# Patient Record
Sex: Male | Born: 1955 | Race: Black or African American | Hispanic: No | Smoking: Former smoker
Health system: Southern US, Community
[De-identification: ages and names within clinical notes are randomized; demographics above are authoritative.]

## PROBLEM LIST (undated history)

## (undated) DIAGNOSIS — F1210 Cannabis abuse, uncomplicated: Secondary | ICD-10-CM

## (undated) DIAGNOSIS — K5790 Diverticulosis of intestine, part unspecified, without perforation or abscess without bleeding: Secondary | ICD-10-CM

## (undated) DIAGNOSIS — E119 Type 2 diabetes mellitus without complications: Secondary | ICD-10-CM

## (undated) DIAGNOSIS — B182 Chronic viral hepatitis C: Secondary | ICD-10-CM

## (undated) DIAGNOSIS — K219 Gastro-esophageal reflux disease without esophagitis: Secondary | ICD-10-CM

## (undated) DIAGNOSIS — M5126 Other intervertebral disc displacement, lumbar region: Secondary | ICD-10-CM

## (undated) DIAGNOSIS — K30 Functional dyspepsia: Secondary | ICD-10-CM

## (undated) HISTORY — DX: Functional dyspepsia: K30

## (undated) HISTORY — DX: Type 2 diabetes mellitus without complications: E11.9

## (undated) HISTORY — PX: FACIAL RECONSTRUCTION SURGERY: SHX631

---

## 1997-09-24 ENCOUNTER — Emergency Department (HOSPITAL_COMMUNITY): Admission: EM | Admit: 1997-09-24 | Discharge: 1997-09-24 | Payer: Self-pay | Admitting: Emergency Medicine

## 1998-09-01 ENCOUNTER — Encounter: Payer: Self-pay | Admitting: Emergency Medicine

## 1998-09-01 ENCOUNTER — Encounter: Payer: Self-pay | Admitting: Otolaryngology

## 1998-09-01 ENCOUNTER — Inpatient Hospital Stay (HOSPITAL_COMMUNITY): Admission: EM | Admit: 1998-09-01 | Discharge: 1998-09-12 | Payer: Self-pay | Admitting: Emergency Medicine

## 1998-09-05 ENCOUNTER — Encounter: Payer: Self-pay | Admitting: Otolaryngology

## 2002-08-10 ENCOUNTER — Emergency Department (HOSPITAL_COMMUNITY): Admission: EM | Admit: 2002-08-10 | Discharge: 2002-08-10 | Payer: Self-pay | Admitting: Emergency Medicine

## 2002-08-15 ENCOUNTER — Inpatient Hospital Stay (HOSPITAL_COMMUNITY): Admission: EM | Admit: 2002-08-15 | Discharge: 2002-08-16 | Payer: Self-pay | Admitting: Emergency Medicine

## 2002-08-15 ENCOUNTER — Encounter: Payer: Self-pay | Admitting: Emergency Medicine

## 2002-08-15 ENCOUNTER — Encounter: Payer: Self-pay | Admitting: Internal Medicine

## 2002-09-14 ENCOUNTER — Ambulatory Visit (HOSPITAL_COMMUNITY): Admission: RE | Admit: 2002-09-14 | Discharge: 2002-09-15 | Payer: Self-pay | Admitting: General Surgery

## 2002-09-14 ENCOUNTER — Encounter: Payer: Self-pay | Admitting: General Surgery

## 2003-11-21 ENCOUNTER — Emergency Department (HOSPITAL_COMMUNITY): Admission: EM | Admit: 2003-11-21 | Discharge: 2003-11-21 | Payer: Self-pay | Admitting: Emergency Medicine

## 2005-06-25 HISTORY — PX: TRACHEOSTOMY: SUR1362

## 2009-06-25 HISTORY — PX: CHOLECYSTECTOMY: SHX55

## 2010-12-05 ENCOUNTER — Emergency Department (HOSPITAL_COMMUNITY)
Admission: EM | Admit: 2010-12-05 | Discharge: 2010-12-05 | Disposition: A | Discharge status: Home / Self Care | Payer: Self-pay | Attending: Emergency Medicine | Admitting: Emergency Medicine

## 2010-12-05 ENCOUNTER — Emergency Department (HOSPITAL_COMMUNITY): Payer: Self-pay

## 2010-12-05 DIAGNOSIS — R109 Unspecified abdominal pain: Secondary | ICD-10-CM | POA: Insufficient documentation

## 2010-12-05 DIAGNOSIS — K5900 Constipation, unspecified: Secondary | ICD-10-CM | POA: Insufficient documentation

## 2010-12-05 DIAGNOSIS — R112 Nausea with vomiting, unspecified: Secondary | ICD-10-CM | POA: Insufficient documentation

## 2010-12-05 LAB — POCT I-STAT, CHEM 8
BUN: 16 mg/dL (ref 6–23)
Calcium, Ion: 1.14 mmol/L (ref 1.12–1.32)
Chloride: 101 mEq/L (ref 96–112)
Creatinine, Ser: 1.1 mg/dL (ref 0.4–1.5)
Glucose, Bld: 156 mg/dL — ABNORMAL HIGH (ref 70–99)
HCT: 52 % (ref 39.0–52.0)
Hemoglobin: 17.7 g/dL — ABNORMAL HIGH (ref 13.0–17.0)
Potassium: 4.1 mEq/L (ref 3.5–5.1)
Sodium: 137 mEq/L (ref 135–145)
TCO2: 28 mmol/L (ref 0–100)

## 2010-12-05 LAB — DIFFERENTIAL
Basophils Absolute: 0 10*3/uL (ref 0.0–0.1)
Basophils Relative: 0 % (ref 0–1)
Lymphocytes Relative: 14 % (ref 12–46)
Neutro Abs: 11.2 10*3/uL — ABNORMAL HIGH (ref 1.7–7.7)

## 2010-12-05 LAB — URINALYSIS, ROUTINE W REFLEX MICROSCOPIC
Glucose, UA: 100 mg/dL — AB
Leukocytes, UA: NEGATIVE
Protein, ur: NEGATIVE mg/dL
Specific Gravity, Urine: 1.018 (ref 1.005–1.030)
Urobilinogen, UA: 2 mg/dL — ABNORMAL HIGH (ref 0.0–1.0)

## 2010-12-05 LAB — CBC
HCT: 43.9 % (ref 39.0–52.0)
Hemoglobin: 16.2 g/dL (ref 13.0–17.0)
MCHC: 36.9 g/dL — ABNORMAL HIGH (ref 30.0–36.0)
RDW: 13.7 % (ref 11.5–15.5)
WBC: 13.9 10*3/uL — ABNORMAL HIGH (ref 4.0–10.5)

## 2010-12-05 LAB — URINE MICROSCOPIC-ADD ON

## 2010-12-05 MED ORDER — IOHEXOL 300 MG/ML  SOLN
100.0000 mL | Freq: Once | INTRAMUSCULAR | Status: AC | PRN
  Administered 2010-12-05: 100 mL via INTRAVENOUS

## 2011-09-02 ENCOUNTER — Emergency Department (HOSPITAL_COMMUNITY)
Admission: EM | Admit: 2011-09-02 | Discharge: 2011-09-02 | Disposition: A | Discharge status: Home / Self Care | Payer: Self-pay | Attending: Emergency Medicine | Admitting: Emergency Medicine

## 2011-09-02 ENCOUNTER — Emergency Department (HOSPITAL_COMMUNITY): Payer: Self-pay

## 2011-09-02 ENCOUNTER — Encounter (HOSPITAL_COMMUNITY): Payer: Self-pay | Admitting: Emergency Medicine

## 2011-09-02 DIAGNOSIS — IMO0001 Reserved for inherently not codable concepts without codable children: Secondary | ICD-10-CM | POA: Insufficient documentation

## 2011-09-02 DIAGNOSIS — M5380 Other specified dorsopathies, site unspecified: Secondary | ICD-10-CM | POA: Insufficient documentation

## 2011-09-02 DIAGNOSIS — M79609 Pain in unspecified limb: Secondary | ICD-10-CM | POA: Insufficient documentation

## 2011-09-02 DIAGNOSIS — M5137 Other intervertebral disc degeneration, lumbosacral region: Secondary | ICD-10-CM | POA: Insufficient documentation

## 2011-09-02 DIAGNOSIS — M545 Low back pain, unspecified: Secondary | ICD-10-CM | POA: Insufficient documentation

## 2011-09-02 DIAGNOSIS — M51379 Other intervertebral disc degeneration, lumbosacral region without mention of lumbar back pain or lower extremity pain: Secondary | ICD-10-CM | POA: Insufficient documentation

## 2011-09-02 DIAGNOSIS — IMO0002 Reserved for concepts with insufficient information to code with codable children: Secondary | ICD-10-CM

## 2011-09-02 MED ORDER — DEXAMETHASONE SODIUM PHOSPHATE 4 MG/ML IJ SOLN
8.0000 mg | Freq: Once | INTRAMUSCULAR | Status: AC
  Administered 2011-09-02: 8 mg via INTRAVENOUS
  Filled 2011-09-02: qty 2

## 2011-09-02 MED ORDER — DEXAMETHASONE 4 MG PO TABS: ORAL_TABLET | ORAL | Status: AC

## 2011-09-02 MED ORDER — DIAZEPAM 5 MG PO TABS
5.0000 mg | ORAL_TABLET | Freq: Once | ORAL | Status: AC
  Administered 2011-09-02: 5 mg via ORAL
  Filled 2011-09-02 (×2): qty 1

## 2011-09-02 MED ORDER — SODIUM CHLORIDE 0.9 % IV SOLN
INTRAVENOUS | Status: DC
  Administered 2011-09-02: 09:00:00 via INTRAVENOUS

## 2011-09-02 MED ORDER — OXYCODONE-ACETAMINOPHEN 5-325 MG PO TABS: ORAL_TABLET | ORAL | Status: DC

## 2011-09-02 MED ORDER — ONDANSETRON HCL 4 MG/2ML IJ SOLN
4.0000 mg | Freq: Once | INTRAMUSCULAR | Status: AC
  Administered 2011-09-02: 4 mg via INTRAVENOUS
  Filled 2011-09-02: qty 2

## 2011-09-02 MED ORDER — DIPHENHYDRAMINE HCL 50 MG/ML IJ SOLN
12.5000 mg | Freq: Once | INTRAMUSCULAR | Status: AC
  Administered 2011-09-02: 12.5 mg via INTRAVENOUS
  Filled 2011-09-02: qty 1

## 2011-09-02 MED ORDER — HYDROMORPHONE HCL PF 1 MG/ML IJ SOLN
1.0000 mg | Freq: Once | INTRAMUSCULAR | Status: AC
  Administered 2011-09-02: 1 mg via INTRAVENOUS
  Filled 2011-09-02: qty 1

## 2011-09-02 MED ORDER — BACLOFEN 10 MG PO TABS: 10.0000 mg | ORAL_TABLET | Freq: Three times a day (TID) | ORAL | Status: AC

## 2011-09-02 NOTE — Discharge Instructions (Signed)
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 There is some disease at the L5-S1 area. Please call Dr. Ophelia Charter and see him or a member of his team concerning your back. Please apply heating pad to the lower back. Use the prescribed medications as suggested. Please return if any new or worsening symptoms.Degenerative Disc Disease Degenerative disc disease is a condition caused by the changes that occur in the cushions of the backbone (spinal discs) as you grow older. Spinal discs are soft and compressible discs located between the bones of the spine (vertebrae). They act like shock absorbers. Degenerative disc disease can affect the wholespine. However, the neck and lower back are most commonly affected. Many changes can occur in the spinal discs with aging, such as:  The spinal discs may dry and shrink.   Small tears may occur in the tough, outer covering of the disc (annulus).   The disc space may become smaller due to loss of water.   Abnormal growths in the bone (spurs) may occur. This can put pressure on the nerve roots exiting the spinal canal, causing pain.   The spinal canal may become narrowed.  CAUSES  Degenerative disc disease is a condition caused by the changes that occur in the spinal discs with aging. The exact cause is not known, but there is a genetic basis for many patients. Degenerative changes can occur due to loss of fluid in the disc. This makes the disc thinner and reduces the space between the backbones. Small cracks can develop in the outer layer of the disc. This can lead to the breakdown of the disc. You are more likely to get degenerative disc disease if you are overweight. Smoking cigarettes and doing heavy work such as weightlifting can also increase your risk of this condition. Degenerative changes can start after a sudden injury. Growth of bone spurs can compress the nerve roots and cause pain.  SYMPTOMS  The symptoms vary from person to  person. Some people may have no pain, while others have severe pain. The pain may be so severe that it can limit your activities. The location of the pain depends on the part of your backbone that is affected. You will have neck or arm pain if a disc in the neck area is affected. You will have pain in your back, buttocks, or legs if a disc in the lower back is affected. The pain becomes worse while bending, reaching up, or with twisting movements. The pain may start gradually and then get worse as time passes. It may also start after a major or minor injury. You may feel numbness or tingling in the arms or legs.  DIAGNOSIS  Your caregiver will ask you about your symptoms and about activities or habits that may cause the pain. He or she may also ask about any injuries, diseases, ortreatments you have had earlier. Your caregiver will examine you to check for the range of movement that is possible in the affected area, to check for strength in your extremities, and to check for sensation in the areas of the arms and legs supplied by different nerve roots. An X-ray of the spine may be taken. Your caregiver may suggest other imaging tests, such as a computerized magnetic scan (MRI), if needed.  TREATMENT  Treatment includes rest, modifying your activities, and applying ice and heat. Your caregiver may prescribe medicines to reduce your pain and may ask you to do some exercises to strengthen your back. In  some cases, you may need surgery. You and your caregiver will decide on the treatment that is best for you. HOME CARE INSTRUCTIONS   Follow proper lifting and walking techniques as advised by your caregiver.   Maintain good posture.   Exercise regularly as advised.   Perform relaxation exercises.   Change your sitting, standing, and sleeping habits as advised. Change positions frequently.   Lose weight as advised.   Stop smoking if you smoke.   Wear supportive footwear.  SEEK MEDICAL CARE IF:  The  pain does not go away within 1 to 4 weeks. SEEK IMMEDIATE MEDICAL CARE IF:   The pain is severe.   You notice weakness in your arms, hands, or legs.   You begin to lose control of your bladder or bowel.  MAKE SURE YOU:   Understand these instructions.   Will watch your condition.   Will get help right away if you are not doing well or get worse.  Document Released: 04/08/2007 Document Revised: 05/31/2011 Document Reviewed: 04/08/2007 [facility] Patient Information 2012 [place], [place].

## 2011-09-02 NOTE — ED Notes (Signed)
MD at bedside. 

## 2011-09-02 NOTE — ED Provider Notes (Signed)
History     CSN: [id number]  Arrival date & time 09/02/11  4098   First MD Initiated Contact with Patient 09/02/11 0805      Chief Complaint  Patient presents with  . Back Pain    (Consider location/radiation/quality/duration/timing/severity/associated sxs/prior treatment) HPI Comments: Patient states he was in his usual state of good health until approximately one to one and a half weeks ago when he was lifting would and injured his lower back. The patient states that he rested his back continued to have some mild pain but worked through it. 2 days ago he was again lifting some wood and after this he had pain that was going to both buttocks and down to the foot on the right side. The patient states that he now has to crawl to the bathroom crawled to the truck in order to come to the emergency room, and has almost continuous pain. This pain is particularly increased with certain movements. He denies loss of bowel or bladder function. He's not had any recent falls. The patient has tried Tylenol and over-the-counter rubs, but these have not been successful.  Patient is a 56 y.o. male presenting with back pain. The history is provided by the patient.  Back Pain  Pertinent negatives include no chest pain, no abdominal pain and no dysuria.    History reviewed. No pertinent past medical history.  History reviewed. No pertinent past surgical history.  No family history on file.  History  Substance Use Topics  . Smoking status: Current Everyday Smoker  . Smokeless tobacco: Not on file  . Alcohol Use: No      Review of Systems  Constitutional: Negative for activity change.       All ROS Neg except as noted in HPI  HENT: Negative for nosebleeds and neck pain.   Eyes: Negative for photophobia and discharge.  Respiratory: Negative for cough, shortness of breath and wheezing.   Cardiovascular: Negative for chest pain and palpitations.  Gastrointestinal: Negative for abdominal pain and  blood in stool.  Genitourinary: Negative for dysuria, frequency and hematuria.  Musculoskeletal: Positive for back pain. Negative for arthralgias.  Skin: Negative.   Neurological: Negative for dizziness, seizures and speech difficulty.  Psychiatric/Behavioral: Negative for hallucinations and confusion.    Allergies  Review of patient's allergies indicates no known allergies.  Home Medications  No current outpatient prescriptions on file.  BP 140/86  Pulse 88  Temp 98.1 F (36.7 C)  Resp 20  SpO2 100%  Physical Exam  Nursing note and vitals reviewed. Constitutional: He is oriented to person, place, and time. He appears well-developed and well-nourished.  Non-toxic appearance.  HENT:  Head: Normocephalic.  Right Ear: Tympanic membrane and external ear normal.  Left Ear: Tympanic membrane and external ear normal.  Eyes: EOM and lids are normal. Pupils are equal, round, and reactive to light.  Neck: Normal range of motion. Neck supple. Carotid bruit is not present.  Cardiovascular: Normal rate, regular rhythm, normal heart sounds, intact distal pulses and normal pulses.   Pulmonary/Chest: Breath sounds normal. No respiratory distress.  Abdominal: Soft. Bowel sounds are normal. There is no tenderness. There is no guarding.  Musculoskeletal: Normal range of motion.       Pain to palpation of the lower lumbar area. Pain with movement of the legs and the lower back. Mild-to-moderate spasm palpated of the lumbar area.  Lymphadenopathy:       Head (right side): No submandibular adenopathy present.  Head (left side): No submandibular adenopathy present.    He has no cervical adenopathy.  Neurological: He is alert and oriented to person, place, and time. He has normal strength. No cranial nerve deficit or sensory deficit. He exhibits normal muscle tone. Coordination normal.  Skin: Skin is warm and dry.  Psychiatric: He has a normal mood and affect. His speech is normal.    ED  Course  Procedures (including critical care time)  Labs Reviewed - No data to display No results found.   No diagnosis found.    MDM  I have reviewed nursing notes, vital signs, and all appropriate lab and imaging results for this patient. Patient was lifting wood approximately 1-1-1/2 weeks ago and injured his back. Approximately 2 days ago he again reinjured the back. He now has pain almost constantly, worse with certain movements. Patient is tearful during examination. IV pain medicine, and oral muscle relaxant medicine given at this time. X-ray of the lower back is pending.  Xray of the lumbar spine reveals significant L4-L5 spondylosis. Pain improved after iv pain meds, but not resolved. Pt fitted wit crutches. Pt to return if any changes or concerns before being seen by orthopedics.       Kathie Dike, [place] 09/02/11 [phone]

## 2011-09-02 NOTE — ED Notes (Addendum)
Patient with c/o lower back pain that radiates to left leg x 1 week with worsening pain x 2 days. Patient denies injury.

## 2011-09-02 NOTE — ED Notes (Signed)
Pt c/o lower back pain that radiates down left leg for a week,  was picking up wood when he started to experience lower back pain. Pt states that the pain has gotten worse over the week, unable to tolerate walking without assistance. Pillow provided for support between pt's knees, warm blanket given

## 2011-09-04 NOTE — ED Provider Notes (Signed)
Medical screening examination/treatment/procedure(s) were performed by non-physician practitioner and as supervising physician I was immediately available for consultation/collaboration. Jamyra Zweig, MD, FACEP   Arvind Mexicano L Emmalin Jaquess, MD 09/04/11 1955 

## 2011-10-03 ENCOUNTER — Other Ambulatory Visit (HOSPITAL_COMMUNITY): Payer: Self-pay | Admitting: Orthopaedic Surgery

## 2011-10-03 DIAGNOSIS — M549 Dorsalgia, unspecified: Secondary | ICD-10-CM

## 2011-10-05 ENCOUNTER — Ambulatory Visit (HOSPITAL_COMMUNITY)
Admission: RE | Admit: 2011-10-05 | Discharge: 2011-10-05 | Disposition: A | Discharge status: Home / Self Care | Payer: Self-pay | Source: Ambulatory Visit | Attending: Orthopaedic Surgery | Admitting: Orthopaedic Surgery

## 2011-10-05 DIAGNOSIS — R937 Abnormal findings on diagnostic imaging of other parts of musculoskeletal system: Secondary | ICD-10-CM | POA: Insufficient documentation

## 2011-10-05 DIAGNOSIS — M545 Low back pain, unspecified: Secondary | ICD-10-CM | POA: Insufficient documentation

## 2011-10-05 DIAGNOSIS — M51379 Other intervertebral disc degeneration, lumbosacral region without mention of lumbar back pain or lower extremity pain: Secondary | ICD-10-CM | POA: Insufficient documentation

## 2011-10-05 DIAGNOSIS — M5137 Other intervertebral disc degeneration, lumbosacral region: Secondary | ICD-10-CM | POA: Insufficient documentation

## 2011-10-05 DIAGNOSIS — M79609 Pain in unspecified limb: Secondary | ICD-10-CM | POA: Insufficient documentation

## 2011-10-05 DIAGNOSIS — M47817 Spondylosis without myelopathy or radiculopathy, lumbosacral region: Secondary | ICD-10-CM | POA: Insufficient documentation

## 2011-10-05 DIAGNOSIS — R209 Unspecified disturbances of skin sensation: Secondary | ICD-10-CM | POA: Insufficient documentation

## 2011-10-05 DIAGNOSIS — M549 Dorsalgia, unspecified: Secondary | ICD-10-CM

## 2011-11-12 ENCOUNTER — Encounter (HOSPITAL_COMMUNITY): Payer: Self-pay | Admitting: Emergency Medicine

## 2011-11-12 ENCOUNTER — Emergency Department (HOSPITAL_COMMUNITY)
Admission: EM | Admit: 2011-11-12 | Discharge: 2011-11-12 | Disposition: A | Discharge status: Home / Self Care | Payer: Self-pay | Attending: Emergency Medicine | Admitting: Emergency Medicine

## 2011-11-12 DIAGNOSIS — K529 Noninfective gastroenteritis and colitis, unspecified: Secondary | ICD-10-CM

## 2011-11-12 DIAGNOSIS — R112 Nausea with vomiting, unspecified: Secondary | ICD-10-CM | POA: Insufficient documentation

## 2011-11-12 DIAGNOSIS — F17200 Nicotine dependence, unspecified, uncomplicated: Secondary | ICD-10-CM | POA: Insufficient documentation

## 2011-11-12 DIAGNOSIS — K5289 Other specified noninfective gastroenteritis and colitis: Secondary | ICD-10-CM | POA: Insufficient documentation

## 2011-11-12 DIAGNOSIS — R197 Diarrhea, unspecified: Secondary | ICD-10-CM | POA: Insufficient documentation

## 2011-11-12 DIAGNOSIS — R109 Unspecified abdominal pain: Secondary | ICD-10-CM | POA: Insufficient documentation

## 2011-11-12 LAB — CBC
HCT: 43.3 % (ref 39.0–52.0)
MCHC: 35.6 g/dL (ref 30.0–36.0)
MCV: 86.1 fL (ref 78.0–100.0)
Platelets: 204 10*3/uL (ref 150–400)
RDW: 14.1 % (ref 11.5–15.5)
WBC: 12.4 10*3/uL — ABNORMAL HIGH (ref 4.0–10.5)

## 2011-11-12 LAB — BASIC METABOLIC PANEL
BUN: 13 mg/dL (ref 6–23)
CO2: 27 mEq/L (ref 19–32)
Chloride: 101 mEq/L (ref 96–112)
Glucose, Bld: 206 mg/dL — ABNORMAL HIGH (ref 70–99)
Potassium: 4.5 mEq/L (ref 3.5–5.1)

## 2011-11-12 LAB — DIFFERENTIAL
Lymphocytes Relative: 16 % (ref 12–46)
Lymphs Abs: 2 10*3/uL (ref 0.7–4.0)
Monocytes Relative: 3 % (ref 3–12)

## 2011-11-12 MED ORDER — ONDANSETRON HCL 8 MG PO TABS: 8.0000 mg | ORAL_TABLET | ORAL | Status: AC | PRN

## 2011-11-12 MED ORDER — DIPHENOXYLATE-ATROPINE 2.5-0.025 MG PO TABS: 1.0000 | ORAL_TABLET | Freq: Four times a day (QID) | ORAL | Status: AC | PRN

## 2011-11-12 MED ORDER — METOCLOPRAMIDE HCL 5 MG/ML IJ SOLN
10.0000 mg | Freq: Once | INTRAMUSCULAR | Status: AC
  Administered 2011-11-12: 10 mg via INTRAVENOUS
  Filled 2011-11-12: qty 2

## 2011-11-12 MED ORDER — ONDANSETRON HCL 4 MG/2ML IJ SOLN
4.0000 mg | Freq: Once | INTRAMUSCULAR | Status: AC
  Administered 2011-11-12: 4 mg via INTRAVENOUS
  Filled 2011-11-12: qty 2

## 2011-11-12 MED ORDER — SODIUM CHLORIDE 0.9 % IV BOLUS (SEPSIS)
1000.0000 mL | Freq: Once | INTRAVENOUS | Status: AC
  Administered 2011-11-12: 1000 mL via INTRAVENOUS

## 2011-11-12 MED ORDER — OXYCODONE-ACETAMINOPHEN 5-325 MG PO TABS: 1.0000 | ORAL_TABLET | Freq: Four times a day (QID) | ORAL | Status: AC | PRN

## 2011-11-12 MED ORDER — DIPHENHYDRAMINE HCL 50 MG/ML IJ SOLN
25.0000 mg | Freq: Once | INTRAMUSCULAR | Status: AC
  Administered 2011-11-12: 25 mg via INTRAVENOUS
  Filled 2011-11-12: qty 1

## 2011-11-12 MED ORDER — SODIUM CHLORIDE 0.9 % IV SOLN
Freq: Once | INTRAVENOUS | Status: AC
  Administered 2011-11-12: 10:00:00 via INTRAVENOUS

## 2011-11-12 MED ORDER — PANTOPRAZOLE SODIUM 40 MG IV SOLR
40.0000 mg | Freq: Once | INTRAVENOUS | Status: AC
  Administered 2011-11-12: 40 mg via INTRAVENOUS
  Filled 2011-11-12 (×2): qty 40

## 2011-11-12 NOTE — ED Notes (Signed)
Went back in pt's room to see if pt felt like he could be d/c - pt reports feeling nauseated again, heaving.  Dr. Lynelle Doctor notified.  Orders received.

## 2011-11-12 NOTE — ED Notes (Signed)
Pt c/o generalized n/v/d abd pain since 0400

## 2011-11-12 NOTE — ED Notes (Signed)
Pt given po fluids. 

## 2011-11-12 NOTE — ED Notes (Signed)
Pt awake & given some water at this time. NAD noted.

## 2011-11-12 NOTE — ED Notes (Signed)
Pt took one sip of sprite and eating ice chips and tolerating well at this time.  nad noted.  Reports decreased n/v.

## 2011-11-12 NOTE — ED Provider Notes (Signed)
I was asked by nursing staff to see patient for persistent vomiting after his discharge paperwork was done. He was given Reglan 10 mg IV with Benadryl 25 mg IV. He slept for a while however he now is able to drink fluids and has not had any more vomiting. Patient feels ready to go home at this time. Wife was  concerned about possible tick born illness after talking to someone in a store (left the ED for awhile)  however he is not here for fever, joint aches or headaches.  Devoria Albe, MD, [facility]   Ward Givens, MD 11/12/11 2100

## 2011-11-12 NOTE — ED Notes (Signed)
Pt alert & oriented x4, stable gait. Pt given discharge instructions, paperwork & prescription(s). Pt verbalized understanding. Pt left department w/ no further questions.  

## 2011-11-12 NOTE — ED Provider Notes (Signed)
History   This chart was scribed for Donnetta Hutching, MD by Clarita Crane. The patient was seen in room APA05/APA05. Patient's care was started at 0925.    CSN: [id number]  Arrival date & time 11/12/11  4098   First MD Initiated Contact with Patient 11/12/11 [phone]      Chief Complaint  Patient presents with  . Emesis  . Diarrhea  . Abdominal Pain    (Consider location/radiation/quality/duration/timing/severity/associated sxs/prior treatment) HPI Joshua Lester is a 56 y.o. male who presents to the Emergency Department complaining of constant moderate to severe nausea, vomiting and diarrhea onset 6.5 hours ago and persistent since with associated chills and diffuse abdominal pain. Denies chest pain, SOB, fever, cough. Patient reports eating a meal last night prior to onset of pain but family member states they consumed same meal and has not experienced similar symptoms.   History reviewed. No pertinent past medical history.  History reviewed. No pertinent past surgical history.  No family history on file.  History  Substance Use Topics  . Smoking status: Current Everyday Smoker  . Smokeless tobacco: Not on file  . Alcohol Use: No      Review of Systems A complete 10 system review of systems was obtained and all systems are negative except as noted in the HPI and PMH.   Allergies  Review of patient's allergies indicates no known allergies.  Home Medications   Current Outpatient Rx  Name Route Sig Dispense Refill  . HYDROCODONE-ACETAMINOPHEN 5-500 MG PO TABS Oral Take 1 tablet by mouth every 4 (four) hours as needed. For pain    . NAPROXEN 500 MG PO TABS Oral Take 500 mg by mouth 2 (two) times daily.      BP 132/68  Pulse 83  Temp 97.5 F (36.4 C)  Resp 17  Ht 5\' 7"  (1.702 m)  Wt 180 lb (81.647 kg)  BMI 28.19 kg/m2  SpO2 100%  Physical Exam  Nursing note and vitals reviewed. Constitutional: He is oriented to person, place, and time. He appears well-developed  and well-nourished. No distress.  HENT:  Head: Normocephalic and atraumatic.  Eyes: EOM are normal. Pupils are equal, round, and reactive to light.  Neck: Neck supple. No tracheal deviation present.  Cardiovascular: Normal rate and regular rhythm.   No murmur heard. Pulmonary/Chest: Effort normal. No respiratory distress. He has no wheezes. He has no rales.  Abdominal: Soft. He exhibits no distension. There is tenderness (minimum, diffusely. ).  Musculoskeletal: Normal range of motion. He exhibits no edema.  Neurological: He is alert and oriented to person, place, and time. No sensory deficit.  Skin: Skin is warm and dry.  Psychiatric: He has a normal mood and affect. His behavior is normal.    ED Course  Procedures (including critical care time)  DIAGNOSTIC STUDIES: Oxygen Saturation is 100% on room air, normal by my interpretation.    COORDINATION OF CARE: 10:26AM- Patient informed of current plan for treatment and evaluation and agrees with plan at this time.  Will administer Pain medication, antiemetics and blood labs.    Results for orders placed during the hospital encounter of 11/12/11  CBC      Component Value Range   WBC 12.4 (*) 4.0 - 10.5 (K/uL)   RBC 5.03  4.22 - 5.81 (MIL/uL)   Hemoglobin 15.4  13.0 - 17.0 (g/dL)   HCT 47.8  29.5 - 62.1 (%)   MCV 86.1  78.0 - 100.0 (fL)   MCH 30.6  26.0 -  34.0 (pg)   MCHC 35.6  30.0 - 36.0 (g/dL)   RDW 09.8  11.9 - 14.7 (%)   Platelets 204  150 - 400 (K/uL)  DIFFERENTIAL      Component Value Range   Neutrophils Relative 81 (*) 43 - 77 (%)   Neutro Abs 10.0 (*) 1.7 - 7.7 (K/uL)   Lymphocytes Relative 16  12 - 46 (%)   Lymphs Abs 2.0  0.7 - 4.0 (K/uL)   Monocytes Relative 3  3 - 12 (%)   Monocytes Absolute 0.4  0.1 - 1.0 (K/uL)   Eosinophils Relative 0  0 - 5 (%)   Eosinophils Absolute 0.0  0.0 - 0.7 (K/uL)   Basophils Relative 0  0 - 1 (%)   Basophils Absolute 0.0  0.0 - 0.1 (K/uL)  BASIC METABOLIC PANEL      Component  Value Range   Sodium 137  135 - 145 (mEq/L)   Potassium 4.5  3.5 - 5.1 (mEq/L)   Chloride 101  96 - 112 (mEq/L)   CO2 27  19 - 32 (mEq/L)   Glucose, Bld 206 (*) 70 - 99 (mg/dL)   BUN 13  6 - 23 (mg/dL)   Creatinine, Ser 8.29  0.50 - 1.35 (mg/dL)   Calcium 9.8  8.4 - 56.2 (mg/dL)   GFR calc non Af Amer >90  >90 (mL/min)   GFR calc Af Amer >90  >90 (mL/min)  TROPONIN I      Component Value Range   Troponin I <0.30  <0.30 (ng/mL)    No results found.   No diagnosis found.  Date: 11/12/2011  Rate: 65  Rhythm: normal sinus rhythm  QRS Axis: normal  Intervals: normal  ST/T Wave abnormalities: normal  Conduction Disutrbances: none  Narrative Interpretation: unremarkable      MDM  Patient feeling better after 2 L of fluid..  Is urinating freely. No acute abdomen. We'll discharge home on Percocet, Lomotil, Zofran.  Elevated glucose noted      I personally performed the services described in this documentation, which was scribed in my presence. The recorded information has been reviewed and considered.    Donnetta Hutching, MD 11/12/11 1535

## 2011-11-12 NOTE — ED Notes (Signed)
Pt reports has voided x 3 since arrival.  Nad noted.

## 2011-11-12 NOTE — ED Notes (Signed)
Ice Chips given.

## 2011-11-12 NOTE — ED Notes (Signed)
Pt resting calmly w/ eyes closed. Rise & fall of the chest noted. Bed in low position, side rails up x2. NAD noted at this time.  Pt remains on cardiac monitor w/ NIBP vital signs WNL.

## 2011-11-12 NOTE — Discharge Instructions (Signed)
Clear liquids today. Medications for pain, nausea, diarrhea. Rest.  Glucose was elevated today.  This will need to be rechecked in a couple weeks. It is not dangerously high but still higher than normal

## 2011-11-12 NOTE — ED Notes (Signed)
Pt actively vomiting.  meds given. 

## 2011-11-12 NOTE — ED Notes (Signed)
Pt vomited after having a few sips of sprite. Zofran ordered.

## 2011-11-12 NOTE — ED Notes (Signed)
Pt sleeping upon entering room.  edp notified and states to let pt sleep.  Once pt wakes, offer PO fluids to see if pt can keep down.  nad noted at this time.

## 2011-11-12 NOTE — ED Notes (Signed)
Family advised we are waiting for pt to wake up on his own & then going to see if able to drink. Pt remains on monitor, w/ NIBP. NAD noted at this time.

## 2011-11-12 NOTE — ED Notes (Signed)
Pt moaning, c/o "not feeling good."  Comfort measures given.  nad noted.

## 2012-02-24 HISTORY — PX: COLONOSCOPY: SHX174

## 2012-03-07 ENCOUNTER — Emergency Department (HOSPITAL_COMMUNITY)
Admission: EM | Admit: 2012-03-07 | Discharge: 2012-03-07 | Disposition: A | Discharge status: Home / Self Care | Payer: Self-pay | Attending: Emergency Medicine | Admitting: Emergency Medicine

## 2012-03-07 ENCOUNTER — Emergency Department (HOSPITAL_COMMUNITY): Payer: Self-pay

## 2012-03-07 ENCOUNTER — Encounter (HOSPITAL_COMMUNITY): Payer: Self-pay | Admitting: *Deleted

## 2012-03-07 DIAGNOSIS — R109 Unspecified abdominal pain: Secondary | ICD-10-CM

## 2012-03-07 DIAGNOSIS — A049 Bacterial intestinal infection, unspecified: Secondary | ICD-10-CM | POA: Insufficient documentation

## 2012-03-07 DIAGNOSIS — Z79899 Other long term (current) drug therapy: Secondary | ICD-10-CM | POA: Insufficient documentation

## 2012-03-07 DIAGNOSIS — R197 Diarrhea, unspecified: Secondary | ICD-10-CM | POA: Insufficient documentation

## 2012-03-07 LAB — URINALYSIS, ROUTINE W REFLEX MICROSCOPIC
Bilirubin Urine: NEGATIVE
Glucose, UA: NEGATIVE mg/dL
Ketones, ur: NEGATIVE mg/dL
Leukocytes, UA: NEGATIVE
Nitrite: NEGATIVE
Specific Gravity, Urine: 1.015 (ref 1.005–1.030)
pH: 6 (ref 5.0–8.0)

## 2012-03-07 LAB — CBC WITH DIFFERENTIAL/PLATELET
Basophils Absolute: 0 10*3/uL (ref 0.0–0.1)
Basophils Relative: 0 % (ref 0–1)
Eosinophils Absolute: 0 10*3/uL (ref 0.0–0.7)
Eosinophils Relative: 0 % (ref 0–5)
MCH: 30.4 pg (ref 26.0–34.0)
MCHC: 35.9 g/dL (ref 30.0–36.0)
Neutrophils Relative %: 80 % — ABNORMAL HIGH (ref 43–77)
Platelets: 209 10*3/uL (ref 150–400)
RBC: 5.07 MIL/uL (ref 4.22–5.81)
RDW: 13.3 % (ref 11.5–15.5)

## 2012-03-07 LAB — COMPREHENSIVE METABOLIC PANEL
ALT: 31 U/L (ref 0–53)
Albumin: 3.5 g/dL (ref 3.5–5.2)
Alkaline Phosphatase: 68 U/L (ref 39–117)
Calcium: 9.3 mg/dL (ref 8.4–10.5)
Potassium: 3.6 mEq/L (ref 3.5–5.1)
Sodium: 135 mEq/L (ref 135–145)
Total Protein: 7.4 g/dL (ref 6.0–8.3)

## 2012-03-07 LAB — URINE MICROSCOPIC-ADD ON

## 2012-03-07 MED ORDER — ONDANSETRON HCL 4 MG/2ML IJ SOLN
4.0000 mg | Freq: Once | INTRAMUSCULAR | Status: AC
  Administered 2012-03-07: 4 mg via INTRAVENOUS
  Filled 2012-03-07: qty 2

## 2012-03-07 MED ORDER — IOHEXOL 300 MG/ML  SOLN
100.0000 mL | Freq: Once | INTRAMUSCULAR | Status: AC | PRN
  Administered 2012-03-07: 100 mL via INTRAVENOUS

## 2012-03-07 MED ORDER — PROMETHAZINE HCL 25 MG RE SUPP: 25.0000 mg | Freq: Four times a day (QID) | RECTAL | Status: DC | PRN

## 2012-03-07 MED ORDER — SODIUM CHLORIDE 0.9 % IV BOLUS (SEPSIS)
1000.0000 mL | Freq: Once | INTRAVENOUS | Status: AC
  Administered 2012-03-07: 1000 mL via INTRAVENOUS

## 2012-03-07 MED ORDER — HYDROCODONE-ACETAMINOPHEN 5-500 MG PO TABS: 1.0000 | ORAL_TABLET | Freq: Four times a day (QID) | ORAL | Status: AC | PRN

## 2012-03-07 MED ORDER — IOHEXOL 300 MG/ML  SOLN
20.0000 mL | INTRAMUSCULAR | Status: AC
  Administered 2012-03-07 (×2): 20 mL via ORAL

## 2012-03-07 NOTE — ED Notes (Signed)
Pt finished drinking his CT contrast, CT called and notify that pt is ready.

## 2012-03-07 NOTE — ED Notes (Signed)
Daughter is leaving to work, reports she would like to be called with an update at [phone], pt agreed and would like her to be updated.

## 2012-03-07 NOTE — ED Provider Notes (Signed)
History     CSN: [id number]  Arrival date & time 03/07/12  1056   First MD Initiated Contact with Patient 03/07/12 1255      Chief Complaint  Patient presents with  . Nausea  . Diarrhea  . Emesis  . Abdominal Pain    (Consider location/radiation/quality/duration/timing/severity/associated sxs/prior treatment) HPI Comments: 56 year old male presents to the emergency department with continuing abdominal cramping, nausea, vomiting and diarrhea since last Friday while he was in [facility]. States he ate a piece of watermelon and since then he has not been feeling well. His friend also a piece of watermelon and had diarrhea. He went to the emergency department in [facility] and was told he had a bacterial infection of the stomach. He was put on Cipro and Flagyl, given pain medicine and medicine for nausea. Describes his abdominal pain is more of a generalized cramping and not in any specific area. He was vomiting and had diarrhea, however he has been unable to eat so he has not had a bowel movement in about a day or 2. Still with fever and chills. He has no appetite and is nauseated on and off. He is starting to feel weak and lightheaded. He's been drinking a lot of Gatorade and water, however he has been unable to keep that down so he feels dehydrated.  Patient is a 56 y.o. male presenting with diarrhea, vomiting, and abdominal pain. The history is provided by the patient and a significant other.  Diarrhea The primary symptoms include fever, abdominal pain, nausea, vomiting and diarrhea.  The illness is also significant for chills. The illness does not include back pain.  Emesis  Associated symptoms include abdominal pain, chills, diarrhea and a fever.  Abdominal Pain The primary symptoms of the illness include abdominal pain, fever, nausea, vomiting and diarrhea. The primary symptoms of the illness do not include shortness of breath.  Additional symptoms associated with the illness  include chills. Symptoms associated with the illness do not include back pain.    History reviewed. No pertinent past medical history.  History reviewed. No pertinent past surgical history.  History reviewed. No pertinent family history.  History  Substance Use Topics  . Smoking status: Former Games developer  . Smokeless tobacco: Not on file  . Alcohol Use: No      Review of Systems  Constitutional: Positive for fever, chills and appetite change.  HENT: Negative for neck pain and neck stiffness.   Respiratory: Negative for shortness of breath.   Cardiovascular: Negative for chest pain.  Gastrointestinal: Positive for nausea, vomiting, abdominal pain and diarrhea.  Musculoskeletal: Negative for back pain.  Skin: Negative for color change.  Neurological: Positive for weakness and light-headedness. Negative for syncope.  Psychiatric/Behavioral: Negative for confusion.    Allergies  Review of patient's allergies indicates no known allergies.  Home Medications   Current Outpatient Rx  Name Route Sig Dispense Refill  . CIPROFLOXACIN HCL 500 MG PO TABS Oral Take 500 mg by mouth 2 (two) times daily.    Marland Kitchen HYDROCODONE-ACETAMINOPHEN 5-500 MG PO TABS Oral Take 1 tablet by mouth. For pain    . METRONIDAZOLE 500 MG PO TABS Oral Take 500 mg by mouth 3 (three) times daily.    Marland Kitchen PROMETHAZINE HCL 25 MG PO TABS Oral Take 25 mg by mouth every 6 (six) hours as needed. For nausea      BP 142/86  Pulse 87  Temp 99.7 F (37.6 C)  Resp 17  SpO2 99%  Physical Exam  Nursing note and vitals reviewed. Constitutional: He is oriented to person, place, and time. He appears well-developed and well-nourished. No distress.  HENT:  Head: Normocephalic and atraumatic.  Mouth/Throat: Oropharynx is clear and moist.  Eyes: Conjunctivae normal are normal.  Neck: Normal range of motion. Neck supple.  Cardiovascular: Normal rate, regular rhythm, normal heart sounds and intact distal pulses.     Pulmonary/Chest: Effort normal and breath sounds normal.  Abdominal: Soft. Normal appearance and bowel sounds are normal. He exhibits no mass. There is tenderness (generalized). There is no rigidity, no rebound, no guarding and no CVA tenderness.  Musculoskeletal: Normal range of motion. He exhibits no edema.  Neurological: He is alert and oriented to person, place, and time.  Skin: Skin is warm and dry. No rash noted. He is not diaphoretic.  Psychiatric: He has a normal mood and affect. His behavior is normal.    ED Course  Procedures (including critical care time)  Labs Reviewed  CBC WITH DIFFERENTIAL - Abnormal; Notable for the following:    WBC 13.0 (*)     Neutrophils Relative 80 (*)     Neutro Abs 10.4 (*)     Lymphocytes Relative 9 (*)     Monocytes Absolute 1.4 (*)     All other components within normal limits  COMPREHENSIVE METABOLIC PANEL - Abnormal; Notable for the following:    Glucose, Bld 126 (*)     All other components within normal limits  URINALYSIS, ROUTINE W REFLEX MICROSCOPIC  URINE CULTURE   Results for orders placed during the hospital encounter of 03/07/12  CBC WITH DIFFERENTIAL      Component Value Range   WBC 13.0 (*) 4.0 - 10.5 K/uL   RBC 5.07  4.22 - 5.81 MIL/uL   Hemoglobin 15.4  13.0 - 17.0 g/dL   HCT 16.1  09.6 - 04.5 %   MCV 84.6  78.0 - 100.0 fL   MCH 30.4  26.0 - 34.0 pg   MCHC 35.9  30.0 - 36.0 g/dL   RDW 40.9  81.1 - 91.4 %   Platelets 209  150 - 400 K/uL   Neutrophils Relative 80 (*) 43 - 77 %   Neutro Abs 10.4 (*) 1.7 - 7.7 K/uL   Lymphocytes Relative 9 (*) 12 - 46 %   Lymphs Abs 1.2  0.7 - 4.0 K/uL   Monocytes Relative 11  3 - 12 %   Monocytes Absolute 1.4 (*) 0.1 - 1.0 K/uL   Eosinophils Relative 0  0 - 5 %   Eosinophils Absolute 0.0  0.0 - 0.7 K/uL   Basophils Relative 0  0 - 1 %   Basophils Absolute 0.0  0.0 - 0.1 K/uL  COMPREHENSIVE METABOLIC PANEL      Component Value Range   Sodium 135  135 - 145 mEq/L   Potassium 3.6   3.5 - 5.1 mEq/L   Chloride 96  96 - 112 mEq/L   CO2 29  19 - 32 mEq/L   Glucose, Bld 126 (*) 70 - 99 mg/dL   BUN 8  6 - 23 mg/dL   Creatinine, Ser 7.82  0.50 - 1.35 mg/dL   Calcium 9.3  8.4 - 95.6 mg/dL   Total Protein 7.4  6.0 - 8.3 g/dL   Albumin 3.5  3.5 - 5.2 g/dL   AST 25  0 - 37 U/L   ALT 31  0 - 53 U/L   Alkaline Phosphatase 68  39 -  117 U/L   Total Bilirubin 0.9  0.3 - 1.2 mg/dL   GFR calc non Af Amer >90  >90 mL/min   GFR calc Af Amer >90  >90 mL/min  URINALYSIS, ROUTINE W REFLEX MICROSCOPIC      Component Value Range   Color, Urine AMBER (*) YELLOW   APPearance CLEAR  CLEAR   Specific Gravity, Urine 1.015  1.005 - 1.030   pH 6.0  5.0 - 8.0   Glucose, UA NEGATIVE  NEGATIVE mg/dL   Hgb urine dipstick TRACE (*) NEGATIVE   Bilirubin Urine NEGATIVE  NEGATIVE   Ketones, ur NEGATIVE  NEGATIVE mg/dL   Protein, ur NEGATIVE  NEGATIVE mg/dL   Urobilinogen, UA 0.2  0.0 - 1.0 mg/dL   Nitrite NEGATIVE  NEGATIVE   Leukocytes, UA NEGATIVE  NEGATIVE  URINE MICROSCOPIC-ADD ON      Component Value Range   Squamous Epithelial / LPF RARE  RARE   WBC, UA 0-2  <3 WBC/hpf   RBC / HPF 0-2  <3 RBC/hpf   Casts GRANULAR CAST (*) NEGATIVE   Crystals CA OXALATE CRYSTALS (*) NEGATIVE    No results found.   No diagnosis found.    MDM  56 y/o male with continuing n/v/d since being in [place] last Friday. He is on cipro and flagyl. White count mildly elevated. Obtaining CT scan to rule out any other acute process. Case discussed with Remi Haggard, PA-C who will take over care of patient at this time.        Trevor Mace, PA-C 03/07/12 1548

## 2012-03-07 NOTE — ED Provider Notes (Signed)
1545 report received Robyn PA on this 56 year old gentleman whose awaiting CT of the abdomen results. Patient has been on Cipro and Flagyl and Phenergan for 7 days and continues to have nausea and vomiting and diarrhea.  1630 CT of abdomen with no acute findings. Patient was used he informed to finish his antibiotics and have a colonoscopy followup. Prescription was given for Vicodin and Phenergan. Patient and father understands and they are ready for discharge.  Remi Haggard, NP 03/07/12 [phone]

## 2012-03-07 NOTE — ED Notes (Signed)
Pt reports for the last week he has had n/v/d with abdominal pain, reports he has already been to the doctors and was told he has a GI bug. States that medications that he was rx'd are not helping. Pt reports he is cold all the time.

## 2012-03-09 LAB — URINE CULTURE: Colony Count: NO GROWTH

## 2012-03-10 ENCOUNTER — Encounter: Payer: Self-pay | Admitting: Gastroenterology

## 2012-03-10 NOTE — ED Provider Notes (Signed)
Medical screening examination/treatment/procedure(s) were performed by non-physician practitioner and as supervising physician I was immediately available for consultation/collaboration.   Laray Anger, DO 03/10/12 [phone]

## 2012-03-11 NOTE — ED Provider Notes (Signed)
Medical screening examination/treatment/procedure(s) were performed by non-physician practitioner and as supervising physician I was immediately available for consultation/collaboration.   Laray Anger, DO 03/11/12 1406

## 2012-04-03 ENCOUNTER — Ambulatory Visit: Payer: Self-pay | Admitting: Gastroenterology

## 2013-08-13 ENCOUNTER — Emergency Department (HOSPITAL_COMMUNITY)
Admission: EM | Admit: 2013-08-13 | Discharge: 2013-08-13 | Disposition: A | Discharge status: Home / Self Care | Payer: BC Managed Care – PPO | Attending: Emergency Medicine | Admitting: Emergency Medicine

## 2013-08-13 ENCOUNTER — Emergency Department (HOSPITAL_COMMUNITY): Payer: BC Managed Care – PPO

## 2013-08-13 ENCOUNTER — Encounter (HOSPITAL_COMMUNITY): Payer: Self-pay | Admitting: Emergency Medicine

## 2013-08-13 DIAGNOSIS — X500XXA Overexertion from strenuous movement or load, initial encounter: Secondary | ICD-10-CM | POA: Insufficient documentation

## 2013-08-13 DIAGNOSIS — IMO0002 Reserved for concepts with insufficient information to code with codable children: Secondary | ICD-10-CM | POA: Insufficient documentation

## 2013-08-13 DIAGNOSIS — Z87891 Personal history of nicotine dependence: Secondary | ICD-10-CM | POA: Insufficient documentation

## 2013-08-13 DIAGNOSIS — Y9289 Other specified places as the place of occurrence of the external cause: Secondary | ICD-10-CM | POA: Insufficient documentation

## 2013-08-13 DIAGNOSIS — Z792 Long term (current) use of antibiotics: Secondary | ICD-10-CM | POA: Insufficient documentation

## 2013-08-13 DIAGNOSIS — S8392XA Sprain of unspecified site of left knee, initial encounter: Secondary | ICD-10-CM

## 2013-08-13 DIAGNOSIS — Y9389 Activity, other specified: Secondary | ICD-10-CM | POA: Insufficient documentation

## 2013-08-13 MED ORDER — HYDROCODONE-ACETAMINOPHEN 5-325 MG PO TABS: 1.0000 | ORAL_TABLET | ORAL | Status: DC | PRN

## 2013-08-13 MED ORDER — MORPHINE SULFATE 4 MG/ML IJ SOLN
8.0000 mg | Freq: Once | INTRAMUSCULAR | Status: AC
Start: 2013-08-13 — End: 2013-08-13
  Administered 2013-08-13: 8 mg via INTRAMUSCULAR
  Filled 2013-08-13: qty 2

## 2013-08-13 MED ORDER — PROMETHAZINE HCL 12.5 MG PO TABS
12.5000 mg | ORAL_TABLET | Freq: Once | ORAL | Status: AC
  Administered 2013-08-13: 12.5 mg via ORAL
  Filled 2013-08-13: qty 1

## 2013-08-13 MED ORDER — MELOXICAM 7.5 MG PO TABS: ORAL_TABLET | ORAL | Status: DC

## 2013-08-13 MED ORDER — KETOROLAC TROMETHAMINE 10 MG PO TABS
10.0000 mg | ORAL_TABLET | Freq: Once | ORAL | Status: AC
  Administered 2013-08-13: 10 mg via ORAL
  Filled 2013-08-13: qty 1

## 2013-08-13 NOTE — ED Notes (Signed)
2 HRS PTA PT SLIPPED ON A RUG AND HES LEFT LEG BENT UNDERNEATH HIM. PT C/O KNEE PAIN THAT RADIATES DOWN TO HIS TOES.

## 2013-08-13 NOTE — Discharge Instructions (Signed)
The x-ray of your knee is negative for fracture or dislocation. Please use the knee immobilizer given until you're seen by the orthopedist. Please elevate your leg above your waist with pillows. Please apply an ice pack. Please use Mobic 2 times daily with food. May use Norco every 4 hours if needed for pain. This medication may cause drowsiness, please use with caution. Knee Sprain A knee sprain is a tear in the strong bands of tissue that connect the bones (ligaments) of your knee. HOME CARE  Raise (elevate) your injured knee to lessen puffiness (swelling).  To ease pain and puffiness, put ice on the injured area.  Put ice in a plastic bag.  Place a towel between your skin and the bag.  Leave the ice on for 20 minutes, 2 3 times a day.  Only take medicine as told by your doctor.  Do not leave your knee unprotected until pain and stiffness go away (usually 4 6 weeks).  If you have a cast or splint, do not get it wet. If your doctor told you to not take it off, cover it with a plastic bag when you shower or bathe. Do not swim.  Your doctor may have you do exercises to prevent or limit permanent weakness and stiffness. GET HELP RIGHT AWAY IF:   Your cast or splint becomes damaged.  Your pain gets worse.  You have a lot of pain, puffiness, or numbness below the cast or splint. MAKE SURE YOU:   Understand these instructions.  Will watch your condition.  Will get help right away if you are not doing well or get worse. Document Released: 05/30/2009 Document Revised: 04/01/2013 Document Reviewed: 02/17/2013 [facility] Patient Information 2014 [place], [place].

## 2013-08-13 NOTE — ED Provider Notes (Signed)
CSN: [id number]     Arrival date & time 08/13/13  2124 History   First MD Initiated Contact with Patient 08/13/13 2202     Chief Complaint  Patient presents with  . Knee Pain     (Consider location/radiation/quality/duration/timing/severity/associated sxs/prior Treatment) HPI Comments: Patient is a 58 year old male who presents to the emergency department with a complaint of left leg pain. The patient states that his foot slipped on a rug that was on a hard wood floor, and his left leg bent underneath him. He states that initially he had some discomfort but was able to go to bed but after he got in bed the pain began to get progressively worse to the point that it was throbbing and severe. The patient states he has not had any previous operations or procedures involving the left knee. The patient denies being on any blood thinning type medications. He denies any hip problems. Complains however of some foot and toe problems on the left. No other injuries reported. The patient has not taken any medication up to this point.  Patient is a 58 y.o. male presenting with knee pain. The history is provided by the patient.  Knee Pain Associated symptoms: no back pain and no neck pain     History reviewed. No pertinent past medical history. Past Surgical History  Procedure Laterality Date  . Facial reconstruction surgery     History reviewed. No pertinent family history. History  Substance Use Topics  . Smoking status: Former Games developermoker  . Smokeless tobacco: Not on file  . Alcohol Use: No    Review of Systems  Constitutional: Negative for activity change.       All ROS Neg except as noted in HPI  HENT: Negative for nosebleeds.   Eyes: Negative for photophobia and discharge.  Respiratory: Negative for cough, shortness of breath and wheezing.   Cardiovascular: Negative for chest pain and palpitations.  Gastrointestinal: Negative for abdominal pain and blood in stool.  Genitourinary: Negative for  dysuria, frequency and hematuria.  Musculoskeletal: Negative for arthralgias, back pain and neck pain.  Skin: Negative.   Neurological: Negative for dizziness, seizures and speech difficulty.  Psychiatric/Behavioral: Negative for hallucinations and confusion.      Allergies  Review of patient's allergies indicates no known allergies.  Home Medications   Current Outpatient Rx  Name  Route  Sig  Dispense  Refill  . ciprofloxacin (CIPRO) 500 MG tablet   Oral   Take 500 mg by mouth 2 (two) times daily.         Marland Kitchen. HYDROcodone-acetaminophen (VICODIN) 5-500 MG per tablet   Oral   Take 1 tablet by mouth. For pain         . metroNIDAZOLE (FLAGYL) 500 MG tablet   Oral   Take 500 mg by mouth 3 (three) times daily.         Marland Kitchen. EXPIRED: promethazine (PHENERGAN) 25 MG suppository   Rectal   Place 1 suppository (25 mg total) rectally every 6 (six) hours as needed for nausea.   12 each   0   . promethazine (PHENERGAN) 25 MG tablet   Oral   Take 25 mg by mouth every 6 (six) hours as needed. For nausea          BP 145/92  Pulse 97  Temp(Src) 98.7 F (37.1 C)  Resp 24  Ht 5\' 7"  (1.702 m)  Wt 195 lb (88.451 kg)  BMI 30.53 kg/m2  SpO2 96% Physical Exam  Nursing  note and vitals reviewed. Constitutional: He is oriented to person, place, and time. He appears well-developed and well-nourished.  Non-toxic appearance.  HENT:  Head: Normocephalic.  Right Ear: Tympanic membrane and external ear normal.  Left Ear: Tympanic membrane and external ear normal.  Eyes: EOM and lids are normal. Pupils are equal, round, and reactive to light.  Neck: Normal range of motion. Neck supple. Carotid bruit is not present.  Cardiovascular: Normal rate, regular rhythm, normal heart sounds, intact distal pulses and normal pulses.   Pulmonary/Chest: Breath sounds normal. No respiratory distress.  Abdominal: Soft. Bowel sounds are normal. There is no tenderness. There is no guarding.  Musculoskeletal:  Normal range of motion.  No pain or deformity to palpation of the left hip. No pain to movement of the pelvis. There is no hematoma or deformity of the left eye. There is pain near the anterior tibial tuberosity, and pain just under the patella anteriorly to palpation. There is pain at the lateral portion of the quadricep area near the knee. No effusion appreciated. Patient would not cooperate for any additional testing. There is no deformity of the tibia. No hematoma palpated of the fibula area. There is full range of motion of the toes. There is no palpable deformity of the foot. There is no swelling of the left foot. The dorsalis pedis pulses 2+.  Lymphadenopathy:       Head (right side): No submandibular adenopathy present.       Head (left side): No submandibular adenopathy present.    He has no cervical adenopathy.  Neurological: He is alert and oriented to person, place, and time. He has normal strength. No cranial nerve deficit or sensory deficit.  Skin: Skin is warm and dry.  Psychiatric: He has a normal mood and affect. His speech is normal.    ED Course  Procedures (including critical care time) Labs Review Labs Reviewed - No data to display Imaging Review No results found.  EKG Interpretation   None       MDM Patient slipped on a rug 2 hours prior to coming to the emergency room and injured the left knee. Patient moaning and groaning in pain. No visible or palpable deformity appreciated on examination.    Final diagnoses:  None    *I have reviewed nursing notes, vital signs, and all appropriate lab and imaging results for this patient.**  X-ray of the left knee is negative for fracture, effusion, or dislocation. X-ray of the foot is negative for fracture or dislocation. The patient is fitted with a knee immobilizer and crutches. He is given instructions to apply ice and elevate the knee. He is advised to call Dr. Hilda Lias tomorrow for an appointment next week for  additional evaluation of his knee. Prescription for Mobic 7.5, and Norco 5 mg given to the patient. Patient's pain much improved after intramuscular morphine and oral Toradol.  Kathie Dike, PA-C 08/13/13 2329

## 2013-08-13 NOTE — ED Notes (Signed)
Knee immobilizer applied to left knee; patient refused crutches; states has crutches at home.  Prescriptions given for Vicodin and Mobic; effects and use explained.  Patient verbalized understanding to follow up with orthopedist if not any better and sedating effects of Vicodin.  Patient discharged home in good condition.

## 2013-08-14 NOTE — ED Provider Notes (Signed)
Medical screening examination/treatment/procedure(s) were performed by non-physician practitioner and as supervising physician I was immediately available for consultation/collaboration.  EKG Interpretation   None        Juliet RudeNathan R. Rubin PayorPickering, MD 08/14/13 0005

## 2014-06-25 DIAGNOSIS — K30 Functional dyspepsia: Secondary | ICD-10-CM

## 2014-06-25 HISTORY — DX: Functional dyspepsia: K30

## 2014-11-25 ENCOUNTER — Other Ambulatory Visit: Payer: Self-pay | Admitting: Orthopaedic Surgery

## 2014-11-25 DIAGNOSIS — M545 Low back pain: Secondary | ICD-10-CM

## 2014-11-28 ENCOUNTER — Ambulatory Visit
Admission: RE | Admit: 2014-11-28 | Discharge: 2014-11-28 | Disposition: A | Discharge status: Home / Self Care | Payer: BLUE CROSS/BLUE SHIELD | Source: Ambulatory Visit | Attending: Orthopaedic Surgery | Admitting: Orthopaedic Surgery

## 2014-11-28 DIAGNOSIS — M545 Low back pain: Secondary | ICD-10-CM

## 2014-12-13 ENCOUNTER — Other Ambulatory Visit (HOSPITAL_COMMUNITY): Payer: Self-pay | Admitting: Orthopaedic Surgery

## 2014-12-16 ENCOUNTER — Ambulatory Visit (HOSPITAL_COMMUNITY)
Admission: RE | Admit: 2014-12-16 | Discharge: 2014-12-16 | Disposition: A | Discharge status: Home / Self Care | Payer: BLUE CROSS/BLUE SHIELD | Source: Ambulatory Visit | Attending: Orthopaedic Surgery | Admitting: Orthopaedic Surgery

## 2014-12-16 ENCOUNTER — Encounter (HOSPITAL_COMMUNITY): Payer: Self-pay

## 2014-12-16 ENCOUNTER — Encounter (HOSPITAL_COMMUNITY)
Admission: RE | Admit: 2014-12-16 | Discharge: 2014-12-16 | Disposition: A | Discharge status: Home / Self Care | Payer: BLUE CROSS/BLUE SHIELD | Source: Ambulatory Visit | Attending: Orthopaedic Surgery | Admitting: Orthopaedic Surgery

## 2014-12-16 DIAGNOSIS — Z87891 Personal history of nicotine dependence: Secondary | ICD-10-CM | POA: Diagnosis not present

## 2014-12-16 DIAGNOSIS — Z01812 Encounter for preprocedural laboratory examination: Secondary | ICD-10-CM | POA: Diagnosis not present

## 2014-12-16 DIAGNOSIS — Z01818 Encounter for other preprocedural examination: Secondary | ICD-10-CM | POA: Diagnosis not present

## 2014-12-16 DIAGNOSIS — M5126 Other intervertebral disc displacement, lumbar region: Secondary | ICD-10-CM | POA: Diagnosis not present

## 2014-12-16 DIAGNOSIS — Z01810 Encounter for preprocedural cardiovascular examination: Secondary | ICD-10-CM | POA: Insufficient documentation

## 2014-12-16 HISTORY — DX: Other intervertebral disc displacement, lumbar region: M51.26

## 2014-12-16 LAB — CBC
HCT: 43.9 % (ref 39.0–52.0)
HEMOGLOBIN: 15.4 g/dL (ref 13.0–17.0)
MCH: 30.2 pg (ref 26.0–34.0)
MCHC: 35.1 g/dL (ref 30.0–36.0)
MCV: 86.1 fL (ref 78.0–100.0)
Platelets: 198 10*3/uL (ref 150–400)
RBC: 5.1 MIL/uL (ref 4.22–5.81)
RDW: 13.5 % (ref 11.5–15.5)
WBC: 8.7 10*3/uL (ref 4.0–10.5)

## 2014-12-16 LAB — COMPREHENSIVE METABOLIC PANEL
ALBUMIN: 3.8 g/dL (ref 3.5–5.0)
ALK PHOS: 74 U/L (ref 38–126)
ALT: 29 U/L (ref 17–63)
AST: 31 U/L (ref 15–41)
Anion gap: 4 — ABNORMAL LOW (ref 5–15)
BUN: 10 mg/dL (ref 6–20)
CO2: 29 mmol/L (ref 22–32)
Calcium: 9.1 mg/dL (ref 8.9–10.3)
Chloride: 103 mmol/L (ref 101–111)
Creatinine, Ser: 1.11 mg/dL (ref 0.61–1.24)
GFR calc Af Amer: >60 mL/min (ref >60)
GFR calc non Af Amer: >60 mL/min (ref >60)
Glucose, Bld: 95 mg/dL (ref 65–99)
POTASSIUM: 4.2 mmol/L (ref 3.5–5.1)
Sodium: 136 mmol/L (ref 135–145)
Total Bilirubin: 0.9 mg/dL (ref 0.3–1.2)
Total Protein: 8 g/dL (ref 6.5–8.1)

## 2014-12-16 LAB — SURGICAL PCR SCREEN
MRSA, PCR: NEGATIVE
Staphylococcus aureus: NEGATIVE

## 2014-12-16 LAB — PROTIME-INR
INR: 1.02 (ref 0.00–1.49)
Prothrombin Time: 13.6 seconds (ref 11.6–15.2)

## 2014-12-16 NOTE — Pre-Procedure Instructions (Signed)
Joshua Lester  12/16/2014      [facility] [place], [place] - [address] [place] [address] Phone: [phone] Fax: [phone]    Your procedure is scheduled on Monday, June 27.  Report to [facility] Admitting at 1030 A.M.  Call this number if you have problems the morning of surgery:  [phone]   Remember:  Do not eat food or drink liquids after midnight.Sunday night  Take these medicines the morning of surgery with A SIP OF WATER : Hydrocodone,  if needed for pain   Do not wear jewelry.  Do not wear lotions, powders, or perfumes. Do not wear deodorant.  Do not shave 48 hours prior to surgery.  Men may shave face and neck.  Do not bring valuables to the hospital.  [facility] is not responsible for any belongings or valuables.  Contacts, dentures or bridgework may not be worn into surgery.  Leave your suitcase in the car.  After surgery it may be brought to your room.  For patients admitted to the hospital, discharge time will be determined by your treatment team.  Patients discharged the day of surgery will not be allowed to drive home.     Special instructions:  [place] - Preparing for Surgery  Before surgery, you can play an important role.  Because skin is not sterile, your skin needs to be as free of germs as possible.  You can reduce the number of germs on you skin by washing with CHG (chlorahexidine gluconate) soap before surgery.  CHG is an antiseptic cleaner which kills germs and bonds with the skin to continue killing germs even after washing.  Please DO NOT use if you have an allergy to CHG or antibacterial soaps.  If your skin becomes reddened/irritated stop using the CHG and inform your nurse when you arrive at Short Stay.  Do not shave (including legs and underarms) for at least 48 hours prior to the first CHG shower.  You may shave your face.  Please follow these instructions  carefully:   1.  Shower with CHG Soap the night before surgery and the    morning of Surgery.  2.  If you choose to wash your hair, wash your hair first as usual with your  normal shampoo.  3.  After you shampoo, rinse your hair and body thoroughly to remove the  Shampoo.  4.  Use CHG as you would any other liquid soap.  You can apply chg directly   to the skin and wash gently with scrungie or a clean washcloth.  5.  Apply the CHG Soap to your body ONLY FROM THE NECK DOWN.    Do not use on open wounds or open sores.  Avoid contact with your eyes,   ears, mouth and genitals (private parts).  Wash genitals (private parts)   with your normal soap.  6.  Wash thoroughly, paying special attention to the area where your surgery   will be performed.  7.  Thoroughly rinse your body with warm water from the neck down.  8.  DO NOT shower/wash with your normal soap after using and rinsing off   the CHG Soap.  9.  Pat yourself dry with a clean towel.            10.  Wear clean pajamas.            11 .  Place clean sheets on  your bed the night of your first shower and do not   sleep with pets.  Day of Surgery  Do not apply any lotions/deoderants the morning of surgery.  Please wear clean clothes to the hospital/surgery center.    Please read over the following fact sheets that you were given. Pain Booklet, Coughing and Deep Breathing, Total Joint Packet and Surgical Site Infection Prevention

## 2014-12-16 NOTE — Progress Notes (Signed)
   12/16/14 1522  OBSTRUCTIVE SLEEP APNEA  Have you ever been diagnosed with sleep apnea through a sleep study? No  Do you snore loudly (loud enough to be heard through closed doors)?  1  Do you often feel tired, fatigued, or sleepy during the daytime? 0  Has anyone observed you stop breathing during your sleep? 0  Do you have, or are you being treated for high blood pressure? 0  BMI more than 35 kg/m2? 0  Age over 57 years old? 1  Neck circumference greater than 40 cm/16 inches? 1 (18.5)  Gender: 1

## 2014-12-16 NOTE — Progress Notes (Signed)
no PCP

## 2014-12-19 NOTE — H&P (Signed)
Joshua Lester is an 59 y.o. male.    CHIEF COMPLAINT:  Back pain and right lower extremity radiculopathy.   HISTORY OF PRESENT ILLNESS:  A 59 year old black male presents to the office today with the above complaint.  He was seen in the office about 3 years ago for low back pain and more left leg symptoms.  He had a lumbar spine MRI scan done on 10/05/2011, and the report read L3-4 diffuse disk bulge noted with mild bilateral foraminal stenosis and moderate bilateral subarticular lateral recess stenosis.  AP diameter of the thecal sac was 9 mm, compatible with mild central stenosis.  L4-5 left greater than right facet arthropathy noted with disk osteophyte complex.  Mild right and moderate left foraminal stenosis in addition to mild bilateral subarticular lateral recess stenosis.  AP diameter of the thecal sac is 10 mm.  L5-S1 large left paracentral and lateral recess disk protrusion is present superimposed on a disk bulge and mild facet arthropathy.  Marked left subarticular lateral recess stenosis as well as severe left eccentric central stenosis.  Patient did have ESIs with Dr. Alvester Morin that gave temporary improvement.  Dr. Ophelia Charter had previously discussed surgical intervention.  Patient stated that he could not have this done due to his work.  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  Since patient was seen 3 years ago, he has had ongoing off-and-on pain in his back.  This has been worse over the last week or so.  He states he is currently having more symptoms in the right leg with radiation of pain down from his buttock to his calf and foot.  He also has some feeling of right quad weakness.  No complaints of bowel or bladder incontinence.  Some intermittent feeling of right groin pain.  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  He also admits to not having  a primary care physician for several years.  Has tried over-the-counter NSAIDs without much improvement.   CURRENT MEDICATIONS:  None listed.   ALLERGIES:  NKDA.   PAST MEDICAL/SURGICAL HISTORY:  Nothing listed.   FAMILY HISTORY:  Positive for diabetes, cancer, and hypertension.   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  He states that he smoked for 35 years but quit.  Denies alcohol consumption.   REVIEW OF SYSTEMS:  Nothing listed.    Past Medical History  Diagnosis Date  . Lumbar herniated disc   . Medical history non-contributory     Past Surgical History  Procedure Laterality Date  . Facial reconstruction surgery    . Cholecystectomy  2011    No family history on file. Social History:  reports that he has quit smoking. His smoking use included Cigarettes. He has a 20 pack-year smoking history. He does not have any smokeless tobacco history on file. He reports that he uses illicit drugs (Marijuana). He reports that he does not drink alcohol.  Allergies: No Known Allergies  No prescriptions prior to admission    No results found for this or any previous visit (from the past 48 hour(s)). No results found.  Review of Systems  Constitutional: Negative.   HENT: Negative.   Eyes: Negative.   Respiratory: Negative.   Cardiovascular: Negative.   Musculoskeletal: Positive for back pain.  Skin: Negative.   Neurological: Positive for tingling.  Psychiatric/Behavioral: Negative.     There were no vitals taken for this visit. Physical Exam  PHYSICAL EXAMINATION:  Height 5 feet 7 inches.  Weight 195 pounds.  A pleasant black male alert and oriented x3 and in no acute distress.  Gait is somewhat antalgic.  He has lumbar paraspinal tenderness.  Positive right greater than left sciatic notch tenderness.  Log roll of right hip causes some diffuse pain in his hip, negative on the left side.  Positive right straight leg raise, negative  on the left.  He does have a trace right quad weakness with resistance.  All other strength maneuvers intact.  Sensation intact.  Bilateral calves nontender.  Pedal pulses are intact.   RADIOGRAPHS:  X-rays, lumbar spine AP and lateral views, show multilevel degenerative disk disease.  No acute findings.   EXAM: MRI LUMBAR SPINE WITHOUT CONTRAST  TECHNIQUE: Multiplanar, multisequence MR imaging of the lumbar spine was performed. No intravenous contrast was administered.  COMPARISON: 10/05/2011  FINDINGS: Stable alignment of the lumbar vertebral bodies. Moderate degenerative lumbar spondylosis with disc disease and facet disease mainly in the lower lumbar spine. The vertebral bodies demonstrate normal marrow signal. Moderate endplate reactive changes are noted. The facets are normally aligned. Moderate facet disease but no definite pars defects. The conus medullaris terminates at L1.  No significant paraspinal or retroperitoneal findings.  T12-L1: Facet disease but no disc protrusions, spinal or foraminal stenosis.  L1-2: Mild facet disease but no disc protrusions, spinal or foraminal stenosis.  L2-3: Moderate facet disease. Mild bulging annulus but no spinal or foraminal stenosis. Mild lateral recess encroachment bilaterally.  L3-4: Diffuse bulging degenerated annulus and moderate to advanced facet disease contributing to mild bilateral lateral recess stenosis and mild bilateral foraminal stenosis. These findings appear stable.  L4-5: Stable advanced degenerative disc disease and facet disease with a bulging annulus and osteophytic ridging. No significant spinal stenosis. There is mild bilateral lateral recess and mild bilateral foraminal stenosis.  L5-S1: Stable moderate to large central disc protrusions slightly asymmetric left. Persistent mass effect on the thecal sac and on both S1 nerve roots, left greater than right. The exiting L5 nerve roots appear  normal.  IMPRESSION: 1. Stable degenerative disc disease and facet disease at L3-4 and L4-5. Mild bilateral lateral recess and foraminal stenosis appears stable. 2. Stable moderate to large central and left paracentral disc protrusion at L5-S1 with mass effect on the thecal sac and both S1 nerve roots, left greater than right.   ASSESSMENT:  Chronic low back pain with right lower extremity radiculopathy.  L5-S1 HNP   PLAN:  We will proceed with L5-S1 microdiscectomy as scheduled.  Surgical procedure along with possible risks and complications discussed.  All questions answered.    Payson Crumby M 12/19/2014, 10:31 PM

## 2014-12-20 ENCOUNTER — Encounter (HOSPITAL_COMMUNITY)
Admission: RE | Disposition: A | Discharge status: Home / Self Care | Payer: Self-pay | Source: Ambulatory Visit | Attending: Orthopaedic Surgery

## 2014-12-20 ENCOUNTER — Observation Stay (HOSPITAL_COMMUNITY)
Admission: RE | Admit: 2014-12-20 | Discharge: 2014-12-21 | Disposition: A | Discharge status: Home / Self Care | Payer: BLUE CROSS/BLUE SHIELD | Source: Ambulatory Visit | Attending: Orthopaedic Surgery | Admitting: Orthopaedic Surgery

## 2014-12-20 ENCOUNTER — Ambulatory Visit (HOSPITAL_COMMUNITY): Payer: BLUE CROSS/BLUE SHIELD

## 2014-12-20 ENCOUNTER — Ambulatory Visit (HOSPITAL_COMMUNITY): Payer: BLUE CROSS/BLUE SHIELD | Admitting: Certified Registered"

## 2014-12-20 ENCOUNTER — Encounter (HOSPITAL_COMMUNITY): Payer: Self-pay | Admitting: *Deleted

## 2014-12-20 DIAGNOSIS — E669 Obesity, unspecified: Secondary | ICD-10-CM | POA: Diagnosis not present

## 2014-12-20 DIAGNOSIS — M4806 Spinal stenosis, lumbar region: Secondary | ICD-10-CM | POA: Insufficient documentation

## 2014-12-20 DIAGNOSIS — Z6831 Body mass index (BMI) 31.0-31.9, adult: Secondary | ICD-10-CM | POA: Diagnosis not present

## 2014-12-20 DIAGNOSIS — Z87891 Personal history of nicotine dependence: Secondary | ICD-10-CM | POA: Diagnosis not present

## 2014-12-20 DIAGNOSIS — Z419 Encounter for procedure for purposes other than remedying health state, unspecified: Secondary | ICD-10-CM

## 2014-12-20 DIAGNOSIS — Z9889 Other specified postprocedural states: Secondary | ICD-10-CM

## 2014-12-20 DIAGNOSIS — M1990 Unspecified osteoarthritis, unspecified site: Secondary | ICD-10-CM | POA: Insufficient documentation

## 2014-12-20 DIAGNOSIS — M5116 Intervertebral disc disorders with radiculopathy, lumbar region: Secondary | ICD-10-CM | POA: Diagnosis not present

## 2014-12-20 HISTORY — PX: LUMBAR LAMINECTOMY/DECOMPRESSION MICRODISCECTOMY: SHX5026

## 2014-12-20 SURGERY — LUMBAR LAMINECTOMY/DECOMPRESSION MICRODISCECTOMY
Anesthesia: General | Site: Back

## 2014-12-20 MED ORDER — MIDAZOLAM HCL 5 MG/5ML IJ SOLN
INTRAMUSCULAR | Status: DC | PRN
  Administered 2014-12-20: 2 mg via INTRAVENOUS

## 2014-12-20 MED ORDER — METHOCARBAMOL 500 MG PO TABS
500.0000 mg | ORAL_TABLET | Freq: Four times a day (QID) | ORAL | Status: DC | PRN
Start: 2014-12-20 — End: 2014-12-21
  Administered 2014-12-20 – 2014-12-21 (×3): 500 mg via ORAL
  Filled 2014-12-20 (×2): qty 1

## 2014-12-20 MED ORDER — SODIUM CHLORIDE 0.9 % IJ SOLN
3.0000 mL | Freq: Two times a day (BID) | INTRAMUSCULAR | Status: DC
  Administered 2014-12-20: 3 mL via INTRAVENOUS

## 2014-12-20 MED ORDER — NEOSTIGMINE METHYLSULFATE 10 MG/10ML IV SOLN
INTRAVENOUS | Status: DC | PRN
  Administered 2014-12-20: 3 mg via INTRAVENOUS

## 2014-12-20 MED ORDER — DOCUSATE SODIUM 100 MG PO CAPS
100.0000 mg | ORAL_CAPSULE | Freq: Two times a day (BID) | ORAL | Status: DC
  Administered 2014-12-20: 100 mg via ORAL
  Filled 2014-12-20: qty 1

## 2014-12-20 MED ORDER — OXYCODONE-ACETAMINOPHEN 5-325 MG PO TABS
1.0000 | ORAL_TABLET | ORAL | Status: DC | PRN
  Administered 2014-12-20 – 2014-12-21 (×5): 2 via ORAL
  Filled 2014-12-20 (×5): qty 2

## 2014-12-20 MED ORDER — ONDANSETRON HCL 4 MG/2ML IJ SOLN
INTRAMUSCULAR | Status: DC | PRN
  Administered 2014-12-20: 4 mg via INTRAVENOUS

## 2014-12-20 MED ORDER — PROMETHAZINE HCL 25 MG/ML IJ SOLN: 6.2500 mg | INTRAMUSCULAR | Status: DC | PRN

## 2014-12-20 MED ORDER — METHOCARBAMOL 500 MG PO TABS: 500.0000 mg | ORAL_TABLET | Freq: Four times a day (QID) | ORAL | Status: DC | PRN

## 2014-12-20 MED ORDER — HYDROMORPHONE HCL 1 MG/ML IJ SOLN
0.2500 mg | INTRAMUSCULAR | Status: DC | PRN
  Administered 2014-12-20 (×4): 0.5 mg via INTRAVENOUS

## 2014-12-20 MED ORDER — HYDROMORPHONE HCL 1 MG/ML IJ SOLN
INTRAMUSCULAR | Status: AC
  Administered 2014-12-20: 0.5 mg via INTRAVENOUS
  Filled 2014-12-20: qty 1

## 2014-12-20 MED ORDER — METHOCARBAMOL 1000 MG/10ML IJ SOLN
500.0000 mg | Freq: Four times a day (QID) | INTRAVENOUS | Status: DC | PRN
  Filled 2014-12-20: qty 5

## 2014-12-20 MED ORDER — POTASSIUM CHLORIDE IN NACL 20-0.45 MEQ/L-% IV SOLN
INTRAVENOUS | Status: DC
  Administered 2014-12-20: 17:00:00 via INTRAVENOUS
  Filled 2014-12-20 (×3): qty 1000

## 2014-12-20 MED ORDER — LIDOCAINE HCL (CARDIAC) 20 MG/ML IV SOLN
INTRAVENOUS | Status: DC | PRN
  Administered 2014-12-20: 100 mg via INTRAVENOUS

## 2014-12-20 MED ORDER — ONDANSETRON HCL 4 MG/2ML IJ SOLN
INTRAMUSCULAR | Status: AC
  Filled 2014-12-20: qty 2

## 2014-12-20 MED ORDER — PROPOFOL 10 MG/ML IV BOLUS
INTRAVENOUS | Status: DC | PRN
  Administered 2014-12-20: 170 mg via INTRAVENOUS

## 2014-12-20 MED ORDER — OXYCODONE-ACETAMINOPHEN 5-325 MG PO TABS: 1.0000 | ORAL_TABLET | Freq: Four times a day (QID) | ORAL | Status: DC | PRN

## 2014-12-20 MED ORDER — ONDANSETRON HCL 4 MG/2ML IJ SOLN: 4.0000 mg | INTRAMUSCULAR | Status: DC | PRN

## 2014-12-20 MED ORDER — GLYCOPYRROLATE 0.2 MG/ML IJ SOLN
INTRAMUSCULAR | Status: DC | PRN
  Administered 2014-12-20: 0.4 mg via INTRAVENOUS

## 2014-12-20 MED ORDER — LIDOCAINE HCL (CARDIAC) 20 MG/ML IV SOLN
INTRAVENOUS | Status: AC
  Filled 2014-12-20: qty 5

## 2014-12-20 MED ORDER — BUPIVACAINE HCL (PF) 0.25 % IJ SOLN
INTRAMUSCULAR | Status: AC
  Filled 2014-12-20: qty 30

## 2014-12-20 MED ORDER — CHLORHEXIDINE GLUCONATE 4 % EX LIQD: 60.0000 mL | Freq: Once | CUTANEOUS | Status: DC

## 2014-12-20 MED ORDER — BUPIVACAINE HCL (PF) 0.25 % IJ SOLN
INTRAMUSCULAR | Status: DC | PRN
  Administered 2014-12-20: 10 mL

## 2014-12-20 MED ORDER — LACTATED RINGERS IV SOLN
INTRAVENOUS | Status: DC
  Administered 2014-12-20 (×2): via INTRAVENOUS

## 2014-12-20 MED ORDER — THROMBIN 20000 UNITS EX SOLR
OROMUCOSAL | Status: DC | PRN
  Administered 2014-12-20: 13:00:00 via TOPICAL

## 2014-12-20 MED ORDER — FENTANYL CITRATE (PF) 250 MCG/5ML IJ SOLN
INTRAMUSCULAR | Status: DC | PRN
  Administered 2014-12-20: 50 ug via INTRAVENOUS
  Administered 2014-12-20: 100 ug via INTRAVENOUS

## 2014-12-20 MED ORDER — PHENYLEPHRINE HCL 10 MG/ML IJ SOLN
INTRAMUSCULAR | Status: DC | PRN
  Administered 2014-12-20 (×3): 80 ug via INTRAVENOUS
  Administered 2014-12-20: 40 ug via INTRAVENOUS
  Administered 2014-12-20 (×2): 80 ug via INTRAVENOUS

## 2014-12-20 MED ORDER — FENTANYL CITRATE (PF) 250 MCG/5ML IJ SOLN
INTRAMUSCULAR | Status: AC
  Filled 2014-12-20: qty 5

## 2014-12-20 MED ORDER — PROPOFOL 10 MG/ML IV BOLUS
INTRAVENOUS | Status: AC
  Filled 2014-12-20: qty 20

## 2014-12-20 MED ORDER — SENNOSIDES-DOCUSATE SODIUM 8.6-50 MG PO TABS: 1.0000 | ORAL_TABLET | Freq: Every evening | ORAL | Status: DC | PRN

## 2014-12-20 MED ORDER — CEFAZOLIN SODIUM-DEXTROSE 2-3 GM-% IV SOLR
2.0000 g | INTRAVENOUS | Status: AC
  Administered 2014-12-20: 2 g via INTRAVENOUS
  Filled 2014-12-20: qty 50

## 2014-12-20 MED ORDER — SODIUM CHLORIDE 0.9 % IJ SOLN: 3.0000 mL | INTRAMUSCULAR | Status: DC | PRN

## 2014-12-20 MED ORDER — PHENOL 1.4 % MT LIQD: 1.0000 | OROMUCOSAL | Status: DC | PRN

## 2014-12-20 MED ORDER — PHENYLEPHRINE 40 MCG/ML (10ML) SYRINGE FOR IV PUSH (FOR BLOOD PRESSURE SUPPORT)
PREFILLED_SYRINGE | INTRAVENOUS | Status: AC
  Filled 2014-12-20: qty 10

## 2014-12-20 MED ORDER — SODIUM CHLORIDE 0.9 % IV SOLN: 250.0000 mL | INTRAVENOUS | Status: DC

## 2014-12-20 MED ORDER — ROCURONIUM BROMIDE 100 MG/10ML IV SOLN
INTRAVENOUS | Status: DC | PRN
  Administered 2014-12-20: 50 mg via INTRAVENOUS

## 2014-12-20 MED ORDER — THROMBIN 20000 UNITS EX SOLR
CUTANEOUS | Status: AC
  Filled 2014-12-20: qty 20000

## 2014-12-20 MED ORDER — METHOCARBAMOL 500 MG PO TABS
ORAL_TABLET | ORAL | Status: AC
  Filled 2014-12-20: qty 1

## 2014-12-20 MED ORDER — MIDAZOLAM HCL 2 MG/2ML IJ SOLN
INTRAMUSCULAR | Status: AC
  Filled 2014-12-20: qty 2

## 2014-12-20 MED ORDER — ROCURONIUM BROMIDE 50 MG/5ML IV SOLN
INTRAVENOUS | Status: AC
  Filled 2014-12-20: qty 1

## 2014-12-20 MED ORDER — MENTHOL 3 MG MT LOZG: 1.0000 | LOZENGE | OROMUCOSAL | Status: DC | PRN

## 2014-12-20 SURGICAL SUPPLY — 42 items
ADH SKN CLS APL DERMABOND .7 (GAUZE/BANDAGES/DRESSINGS) ×1
BUR ROUND FLUTED 4 SOFT TCH (BURR) ×1 IMPLANT
CLSR STERI-STRIP ANTIMIC 1/2X4 (GAUZE/BANDAGES/DRESSINGS) ×1 IMPLANT
COVER SURGICAL LIGHT HANDLE (MISCELLANEOUS) ×2 IMPLANT
DERMABOND ADVANCED (GAUZE/BANDAGES/DRESSINGS) ×1
DERMABOND ADVANCED .7 DNX12 (GAUZE/BANDAGES/DRESSINGS) ×1 IMPLANT
DRAPE MICROSCOPE LEICA (MISCELLANEOUS) ×2 IMPLANT
DRAPE PROXIMA HALF (DRAPES) ×4 IMPLANT
DRSG MEPILEX BORDER 4X4 (GAUZE/BANDAGES/DRESSINGS) ×2 IMPLANT
DRSG MEPILEX BORDER 4X8 (GAUZE/BANDAGES/DRESSINGS) ×2 IMPLANT
DURAPREP 26ML APPLICATOR (WOUND CARE) ×2 IMPLANT
ELECT REM PT RETURN 9FT ADLT (ELECTROSURGICAL) ×2
ELECTRODE REM PT RTRN 9FT ADLT (ELECTROSURGICAL) ×1 IMPLANT
GLOVE BIOGEL PI IND STRL 8 (GLOVE) ×2 IMPLANT
GLOVE BIOGEL PI INDICATOR 8 (GLOVE) ×2
GLOVE ORTHO TXT STRL SZ7.5 (GLOVE) ×4 IMPLANT
GOWN STRL REUS W/ TWL LRG LVL3 (GOWN DISPOSABLE) ×2 IMPLANT
GOWN STRL REUS W/ TWL XL LVL3 (GOWN DISPOSABLE) ×1 IMPLANT
GOWN STRL REUS W/TWL 2XL LVL3 (GOWN DISPOSABLE) ×2 IMPLANT
GOWN STRL REUS W/TWL LRG LVL3 (GOWN DISPOSABLE) ×4
GOWN STRL REUS W/TWL XL LVL3 (GOWN DISPOSABLE) ×2
KIT BASIN OR (CUSTOM PROCEDURE TRAY) ×2 IMPLANT
KIT ROOM TURNOVER OR (KITS) ×2 IMPLANT
MANIFOLD NEPTUNE II (INSTRUMENTS) ×2 IMPLANT
NDL HYPO 25GX1X1/2 BEV (NEEDLE) ×1 IMPLANT
NDL SPNL 18GX3.5 QUINCKE PK (NEEDLE) ×1 IMPLANT
NEEDLE HYPO 25GX1X1/2 BEV (NEEDLE) ×2 IMPLANT
NEEDLE SPNL 18GX3.5 QUINCKE PK (NEEDLE) ×2 IMPLANT
NS IRRIG 1000ML POUR BTL (IV SOLUTION) ×2 IMPLANT
PACK LAMINECTOMY ORTHO (CUSTOM PROCEDURE TRAY) ×2 IMPLANT
PAD ARMBOARD 7.5X6 YLW CONV (MISCELLANEOUS) ×4 IMPLANT
PATTIES SURGICAL .5 X.5 (GAUZE/BANDAGES/DRESSINGS) IMPLANT
PATTIES SURGICAL .75X.75 (GAUZE/BANDAGES/DRESSINGS) IMPLANT
SUT BONE WAX W31G (SUTURE) ×1 IMPLANT
SUT VIC AB 0 CT1 27 (SUTURE) ×2
SUT VIC AB 0 CT1 27XBRD ANBCTR (SUTURE) ×1 IMPLANT
SUT VIC AB 2-0 CT1 27 (SUTURE) ×2
SUT VIC AB 2-0 CT1 TAPERPNT 27 (SUTURE) ×1 IMPLANT
SUT VIC AB 3-0 X1 27 (SUTURE) ×1 IMPLANT
TOWEL OR 17X24 6PK STRL BLUE (TOWEL DISPOSABLE) ×2 IMPLANT
TOWEL OR 17X26 10 PK STRL BLUE (TOWEL DISPOSABLE) ×2 IMPLANT
WATER STERILE IRR 1000ML POUR (IV SOLUTION) ×2 IMPLANT

## 2014-12-20 NOTE — Plan of Care (Signed)
Problem: Consults Goal: Diagnosis - Spinal Surgery Microdiscectomy L5-S1     

## 2014-12-20 NOTE — Progress Notes (Signed)
Patient is sleeping post surgery, awaits PT/OT evaluation.

## 2014-12-20 NOTE — Interval H&P Note (Signed)
History and Physical Interval Note:  12/20/2014 12:48 PM  Joshua Lester  has presented today for surgery, with the diagnosis of L5-S1 Bilateral HNP (herniated nucleus pulposus)  The various methods of treatment have been discussed with the patient and family. After consideration of risks, benefits and other options for treatment, the patient has consented to  Procedure(s): L5-S1 Microdiscectomy, Bilateral (N/A) as a surgical intervention .  The patient's history has been reviewed, patient examined, no change in status, stable for surgery.  I have reviewed the patient's chart and labs.  Questions were answered to the patient's satisfaction.     Indigo Barbian C

## 2014-12-20 NOTE — Anesthesia Preprocedure Evaluation (Signed)
Anesthesia Evaluation  Patient identified by MRN, date of birth, ID band Patient awake    Reviewed: Allergy & Precautions, NPO status , Patient's Chart, lab work & pertinent test results  History of Anesthesia Complications Negative for: history of anesthetic complications  Airway Mallampati: II  TM Distance: >3 FB Neck ROM: Full  Mouth opening: Limited Mouth Opening  Dental  (+) Teeth Intact, Chipped   Pulmonary former smoker,  breath sounds clear to auscultation        Cardiovascular negative cardio ROS  Rhythm:Regular Rate:Normal     Neuro/Psych negative neurological ROS     GI/Hepatic negative GI ROS, Neg liver ROS,   Endo/Other  negative endocrine ROS  Renal/GU negative Renal ROS     Musculoskeletal  (+) Arthritis -,   Abdominal (+) + obese,   Peds  Hematology   Anesthesia Other Findings   Reproductive/Obstetrics                             Anesthesia Physical Anesthesia Plan  ASA: II  Anesthesia Plan: General   Post-op Pain Management:    Induction: Intravenous  Airway Management Planned: Oral ETT  Additional Equipment:   Intra-op Plan:   Post-operative Plan: Extubation in OR  Informed Consent:   Plan Discussed with:   Anesthesia Plan Comments:         Anesthesia Quick Evaluation

## 2014-12-20 NOTE — Transfer of Care (Signed)
Immediate Anesthesia Transfer of Care Note  Patient: Joshua Lester  Procedure(s) Performed: Procedure(s): L5-S1 Microdiscectomy, Bilateral (N/A)  Patient Location: PACU  Anesthesia Type:General  Level of Consciousness: awake, alert , oriented and patient cooperative  Airway & Oxygen Therapy: Patient Spontanous Breathing and Patient connected to nasal cannula oxygen  Post-op Assessment: Report given to RN, Post -op Vital signs reviewed and stable and Patient moving all extremities  Post vital signs: Reviewed and stable  Last Vitals:  Filed Vitals:   12/20/14 1504  BP:   Pulse:   Temp: 36.3 C  Resp:     Complications: No apparent anesthesia complications

## 2014-12-20 NOTE — Anesthesia Procedure Notes (Signed)
Procedure Name: Intubation Date/Time: 12/20/2014 1:02 PM Performed by: Jerilee HohMUMM, Hanad Leino N Pre-anesthesia Checklist: Patient identified, Suction available, Emergency Drugs available and Patient being monitored Patient Re-evaluated:Patient Re-evaluated prior to inductionOxygen Delivery Method: Circle system utilized Preoxygenation: Pre-oxygenation with 100% oxygen Intubation Type: IV induction Ventilation: Mask ventilation without difficulty and Oral airway inserted - appropriate to patient size Laryngoscope Size: Mac and 4 Grade View: Grade I Tube type: Oral Tube size: 7.5 mm Number of attempts: 1 Airway Equipment and Method: Stylet Placement Confirmation: ETT inserted through vocal cords under direct vision,  positive ETCO2 and breath sounds checked- equal and bilateral Secured at: 22 cm Tube secured with: Tape Dental Injury: Teeth and Oropharynx as per pre-operative assessment

## 2014-12-20 NOTE — Care Management Note (Signed)
Case Management Note  Patient Details  Name: Joshua Lester MRN: [id number] Date of Birth: Apr 07, 1956  Subjective/Objective:           L5-S1 Microdiscectomy, Bilateral (N/A) as a surgical intervention .         Action/Plan: Awaits PT/OT evaluations  Expected Discharge Date:       12/21/14           Expected Discharge Plan:     In-House Referral:     Discharge planning Services  CM Consult  Post Acute Care Choice:    Choice offered to:     DME Arranged:    DME Agency:     HH Arranged:    HH Agency:     Status of Service:  In process, will continue to follow  Medicare Important Message Given:    Date Medicare IM Given:    Medicare IM give by:    Date Additional Medicare IM Given:    Additional Medicare Important Message give by:     If discussed at Long Length of Stay Meetings, dates discussed:    Additional Comments:  Governor Specking, RN 12/20/2014, 7:36 PM

## 2014-12-21 ENCOUNTER — Encounter (HOSPITAL_COMMUNITY): Payer: Self-pay | Admitting: Orthopaedic Surgery

## 2014-12-21 DIAGNOSIS — M5116 Intervertebral disc disorders with radiculopathy, lumbar region: Secondary | ICD-10-CM | POA: Diagnosis not present

## 2014-12-21 NOTE — Op Note (Signed)
NAMWanda Lester:  Rogue, Nishant           ACCOUNT NO.:  [id number]  MEDICAL RECORD NO.:  [id number]  LOCATION:  [id number]                        FACILITY:  [facility]  PHYSICIAN:  Emanii Bugbee C. Ophelia CharterYates, M.D.    DATE OF BIRTH:  10-Oct-1955  DATE OF PROCEDURE:  12/20/2014 DATE OF DISCHARGE:                              OPERATIVE REPORT   PREOPERATIVE DIAGNOSIS:  Bilateral L5-S1 herniated nucleus pulposus with radiculopathy.  POSTOPERATIVE DIAGNOSIS:  Bilateral L5-S1 herniated nucleus pulposus with radiculopathy.  PROCEDURE:  Bilateral L5-S1 microdiskectomy.  SURGEON:  Rachard Isidro C. Ophelia CharterYates, M.D.  ASSISTANT:  Genene ChurnJames M. Barry Dieneswens, PA-C, medically necessary and present for the entire procedure.  ANESTHESIA:  GOT plus Marcaine skin local.  ESTIMATED BLOOD LOSS:  Less than 100 mL.  COMPLICATIONS:  None.  INDICATIONS FOR PROCEDURE:  A 59 year old male who has had persistent problems with disk herniation, has had MRI scan back in 2013, which showed large disk herniation at L5-S1 with persistent problems.  He has not gotten any relief with the epidurals and now presents for operative intervention.  MRI scan on November 28, 2014, showed large central and left paracentral disk protrusion at L5-S1 with mass effect on the thecal sac and both S1 nerve roots, left greater than right.  He has had more right leg pain recently than left.  DESCRIPTION OF PROCEDURE:  After induction of general anesthesia, orotracheal intubation, preoperative Ancef prophylaxis, the patient was placed prone on chest rolls after intubation.  Time-out procedure was completed after prepping and draping, Betadine, Steri-Drape.  Spinal needle was placed at 5-1.  Cross-table lateral x-ray confirmed appropriate level.  Incision was made, subperiosteal dissection on the lamina, self-retaining retractor was placed.  Laminotomy was performed on the left.  Penfield 4 was placed down directly over the top of the disk at L5-S1 and repeat imaging confirmed the  appropriate level.  There was a very large disk protrusion, which was very firm.  Annulus was incised and hard chunks of disk were removed using Epstein curettes, some of this was pushed down, but basically had to be carved away with a scalpel and removed with the pituitary.  Disk bulge was present centrally and laminotomy was performed on both sides taking the inferior third, undercutting the spinous process, so that, there was enough room for the D'Errico to maneuver to get close to the midline.  On each side, continued work was performed, switching sides with Zonia KiefJames Owens, PA-C, surgical assistant for removal of the disk.  There was only moderate amount of disk material degenerative in the midline and appeared the disk had ruptured and on the right side, there was a small nipple of fresh disk material, which looked like a secondary protrusion within the very large protrusion on the right side and this was the only area that had some fresh soft disk that was easily removed.  Continued work was performed on each side resecting disk until there was just some disk material left in the midline that could not be completely removed due to too much traction on the dura.  Pieces were swept with an Epstein down, pushed in the disk space and removed.  Undercutting of the lamina had given room dorsally for the dura.  Epidural veins were coagulated with bipolar cautery.  Lateral recesses were well decompressed, irrigation with saline solution.  Palpation of the foramina showed no areas of compression.  #1 closure of the deep layer, 2-0 on subcutaneous tissue, subcuticular closure.  Dermabond on the skin.  Marcaine infiltration, postop dressing, and transferred to the recovery room.     Beckham Buxbaum C. Ophelia Charter, M.D.     MCY/MEDQ  D:  12/20/2014  T:  12/21/2014  Job:  [id number]

## 2014-12-21 NOTE — Progress Notes (Addendum)
Subjective: 1 Day Post-Op Procedure(s) (LRB): L5-S1 Microdiscectomy, Bilateral (N/A) Patient reports pain as mild.    Objective: Vital signs in last 24 hours: Temp:  [97.3 F (36.3 Lester)-98.8 F (37.1 Lester)] 98.3 F (36.8 Lester) (06/28 0452) Pulse Rate:  [63-84] 63 (06/28 0452) Resp:  [10-20] 16 (06/28 0452) BP: (98-141)/(67-90) 99/67 mmHg (06/28 0452) SpO2:  [95 %-100 %] 98 % (06/28 0452) Weight:  [89.812 kg (198 lb)] 89.812 kg (198 lb) (06/27 1113)  Intake/Output from previous day: 06/27 0701 - 06/28 0700 In: 2448.8 [P.O.:720; I.V.:1728.8] Out: 1590 [Urine:1540; Blood:50] Intake/Output this shift:    No results for input(s): HGB in the last 72 hours. No results for input(s): WBC, RBC, HCT, PLT in the last 72 hours. No results for input(s): NA, K, CL, CO2, BUN, CREATININE, GLUCOSE, CALCIUM in the last 72 hours. No results for input(s): LABPT, INR in the last 72 hours.  Neurologically intact  Assessment/Plan: 1 Day Post-Op Procedure(s) (LRB): L5-S1 Microdiscectomy, Bilateral (N/A) Up with therapy Home later today. Good relief of leg pain.  Joshua Lester 12/21/2014, 7:39 AM

## 2014-12-22 NOTE — Anesthesia Postprocedure Evaluation (Signed)
  Anesthesia Post-op Note  Patient: Joshua Lester  Procedure(s) Performed: Procedure(s): L5-S1 Microdiscectomy, Bilateral (N/A)  Patient Location: PACU  Anesthesia Type:General  Level of Consciousness: awake and alert   Airway and Oxygen Therapy: Patient Spontanous Breathing  Post-op Pain: mild  Post-op Assessment: Post-op Vital signs reviewed LLE Motor Response: Purposeful movement, Responds to commands LLE Sensation: Full sensation, No numbness, No tingling RLE Motor Response: Purposeful movement, Responds to commands RLE Sensation: Full sensation, No numbness, No tingling      Post-op Vital Signs: stable  Last Vitals:  Filed Vitals:   12/21/14 0452  BP: 99/67  Pulse: 63  Temp: 36.8 C  Resp: 16    Complications: No apparent anesthesia complications

## 2014-12-24 DIAGNOSIS — B182 Chronic viral hepatitis C: Secondary | ICD-10-CM

## 2014-12-24 HISTORY — DX: Chronic viral hepatitis C: B18.2

## 2015-01-04 NOTE — Discharge Summary (Signed)
Patient ID: Joshua Lester MRN: [id number] DOB/AGE: 03-19-1956 59 y.o.  Admit date: 12/20/2014 Discharge date: 01/04/2015  Admission Diagnoses:  Active Problems:   S/P lumbar discectomy   Discharge Diagnoses:  Active Problems:   S/P lumbar discectomy  status post Procedure(s): L5-S1 Microdiscectomy, Bilateral  Past Medical History  Diagnosis Date  . Lumbar herniated disc   . Medical history non-contributory     Surgeries: Procedure(s): L5-S1 Microdiscectomy, Bilateral on 12/20/2014   Consultants:    Discharged Condition: Improved  Hospital Course: Joshua Lester is an 59 y.o. male who was admitted 12/20/2014 for operative treatment of lumbar HNP. Patient failed conservative treatments (please see the history and physical for the specifics) and had severe unremitting pain that affects sleep, daily activities and work/hobbies. After pre-op clearance, the patient was taken to the operating room on 12/20/2014 and underwent  Procedure(s): L5-S1 Microdiscectomy, Bilateral.    Patient was given perioperative antibiotics:  Anti-infectives    Start     Dose/Rate Route Frequency Ordered Stop   12/20/14 0737  ceFAZolin (ANCEF) IVPB 2 g/50 mL premix     2 g 100 mL/hr over 30 Minutes Intravenous On call to O.R. 12/20/14 0737 12/20/14 1306       Patient was given sequential compression devices and early ambulation to prevent DVT.   Patient benefited maximally from hospital stay and there were no complications. At the time of discharge, the patient was urinating/moving their bowels without difficulty, tolerating a regular diet, pain is controlled with oral pain medications and they have been cleared by PT/OT.   Recent vital signs: No data found.    Recent laboratory studies: No results for input(s): WBC, HGB, HCT, PLT, NA, K, CL, CO2, BUN, CREATININE, GLUCOSE, INR, CALCIUM in the last 72 hours.  Invalid input(s): PT, 2   Discharge Medications:     Medication List     STOP taking these medications        HYDROcodone-acetaminophen 5-325 MG per tablet  Commonly known as:  NORCO      TAKE these medications        methocarbamol 500 MG tablet  Commonly known as:  ROBAXIN  Take 1 tablet (500 mg total) by mouth every 6 (six) hours as needed for muscle spasms.     oxyCODONE-acetaminophen 5-325 MG per tablet  Commonly known as:  ROXICET  Take 1-2 tablets by mouth every 6 (six) hours as needed for severe pain.        Diagnostic Studies: Dg Chest 2 View  12/16/2014   CLINICAL DATA:  Preop for lumbar micro diskectomy on 12/20/2014, former smoker, quit 5 years ago  EXAM: CHEST  2 VIEW  COMPARISON:  None.  FINDINGS: Heart size upper normal. Vascular pattern normal. Lungs clear. Bony thorax intact.  IMPRESSION: No active cardiopulmonary disease.   Electronically Signed   By: Joshua Lester M.D.   On: 12/16/2014 16:32   Dg Lumbar Spine 2-3 Views  12/20/2014   CLINICAL DATA:  L5-S1 microdiskectomy  EXAM: LUMBAR SPINE - 2-3 VIEW  COMPARISON:  MRI lumbar spine dated 11/28/2014  FINDINGS: Two intraoperative lateral radiographs of the lumbar spine.  Initial lumbar radiograph demonstrates a surgical probe at the L5 posterior spinous process.  Second lateral radiograph demonstrates surgical hardware at the L5-S1 level.  IMPRESSION: Intraoperative lumbar localization, as above.   Electronically Signed   By: Joshua Lester M.D.   On: 12/20/2014 14:16        Discharge Instructions  Call MD / Call 911    Complete by:  As directed   If you experience chest pain or shortness of breath, CALL 911 and be transported to the hospital emergency room.  If you develope a fever above 101 F, pus (white drainage) or increased drainage or redness at the wound, or calf pain, call your surgeon's office.     Constipation Prevention    Complete by:  As directed   Drink plenty of fluids.  Prune juice may be helpful.  You may use a stool softener, such as Colace (over the counter)  100 mg twice a day.  Use MiraLax (over the counter) for constipation as needed.     Diet - low sodium heart healthy    Complete by:  As directed      Discharge instructions    Complete by:  As directed   Ok to shower 5 days postop.  Do not apply any creams or ointments to incision.  Do not remove steri-strips.  Can use 4x4 gauze and tape for dressing changes.  No aggressive activity.  No bending, squatting or prolonged sitting.  Mostly be in reclined position or lying down.     Driving restrictions    Complete by:  As directed   No driving     Increase activity slowly as tolerated    Complete by:  As directed      Lifting restrictions    Complete by:  As directed   No lifting           Follow-up Information    Schedule an appointment as soon as possible for a visit with Eldred MangesYATES,MARK C, MD.   Specialty:  Orthopedic Surgery   Why:  need return office visit one week postop.    Contact information:   [address] [place] [address] [phone]       Discharge Plan:  discharge to home  Disposition:     Signed: Naida Lester,Joshua Lester M  01/04/2015, 2:23 PM

## 2015-01-12 ENCOUNTER — Encounter (HOSPITAL_COMMUNITY): Payer: Self-pay | Admitting: *Deleted

## 2015-01-12 ENCOUNTER — Emergency Department (HOSPITAL_COMMUNITY): Payer: BLUE CROSS/BLUE SHIELD

## 2015-01-12 ENCOUNTER — Emergency Department (HOSPITAL_COMMUNITY)
Admission: EM | Admit: 2015-01-12 | Discharge: 2015-01-13 | Disposition: A | Discharge status: Home / Self Care | Payer: BLUE CROSS/BLUE SHIELD | Attending: Emergency Medicine | Admitting: Emergency Medicine

## 2015-01-12 DIAGNOSIS — R197 Diarrhea, unspecified: Secondary | ICD-10-CM | POA: Diagnosis not present

## 2015-01-12 DIAGNOSIS — R109 Unspecified abdominal pain: Secondary | ICD-10-CM

## 2015-01-12 DIAGNOSIS — R112 Nausea with vomiting, unspecified: Secondary | ICD-10-CM | POA: Insufficient documentation

## 2015-01-12 DIAGNOSIS — Z87891 Personal history of nicotine dependence: Secondary | ICD-10-CM | POA: Diagnosis not present

## 2015-01-12 DIAGNOSIS — Z8739 Personal history of other diseases of the musculoskeletal system and connective tissue: Secondary | ICD-10-CM

## 2015-01-12 DIAGNOSIS — M5126 Other intervertebral disc displacement, lumbar region: Secondary | ICD-10-CM | POA: Diagnosis not present

## 2015-01-12 DIAGNOSIS — R1084 Generalized abdominal pain: Secondary | ICD-10-CM

## 2015-01-12 DIAGNOSIS — R05 Cough: Secondary | ICD-10-CM | POA: Diagnosis not present

## 2015-01-12 DIAGNOSIS — R0602 Shortness of breath: Secondary | ICD-10-CM | POA: Diagnosis not present

## 2015-01-12 DIAGNOSIS — K529 Noninfective gastroenteritis and colitis, unspecified: Secondary | ICD-10-CM

## 2015-01-12 LAB — COMPREHENSIVE METABOLIC PANEL
ALT: 41 U/L (ref 17–63)
ANION GAP: 12 (ref 5–15)
AST: 46 U/L — ABNORMAL HIGH (ref 15–41)
Albumin: 4.2 g/dL (ref 3.5–5.0)
Alkaline Phosphatase: 95 U/L (ref 38–126)
BILIRUBIN TOTAL: 1.2 mg/dL (ref 0.3–1.2)
BUN: 14 mg/dL (ref 6–20)
CO2: 25 mmol/L (ref 22–32)
CREATININE: 1.09 mg/dL (ref 0.61–1.24)
Calcium: 9.7 mg/dL (ref 8.9–10.3)
Chloride: 99 mmol/L — ABNORMAL LOW (ref 101–111)
GFR calc non Af Amer: >60 mL/min (ref >60)
Glucose, Bld: 153 mg/dL — ABNORMAL HIGH (ref 65–99)
POTASSIUM: 4.1 mmol/L (ref 3.5–5.1)
SODIUM: 136 mmol/L (ref 135–145)
Total Protein: 9 g/dL — ABNORMAL HIGH (ref 6.5–8.1)

## 2015-01-12 LAB — CBC WITH DIFFERENTIAL/PLATELET
BASOS ABS: 0 10*3/uL (ref 0.0–0.1)
Basophils Relative: 0 % (ref 0–1)
EOS PCT: 1 % (ref 0–5)
Eosinophils Absolute: 0.1 10*3/uL (ref 0.0–0.7)
HCT: 46.9 % (ref 39.0–52.0)
Hemoglobin: 16.7 g/dL (ref 13.0–17.0)
Lymphocytes Relative: 26 % (ref 12–46)
Lymphs Abs: 3.3 10*3/uL (ref 0.7–4.0)
MCH: 30.8 pg (ref 26.0–34.0)
MCHC: 35.6 g/dL (ref 30.0–36.0)
MCV: 86.4 fL (ref 78.0–100.0)
MONO ABS: 0.9 10*3/uL (ref 0.1–1.0)
Monocytes Relative: 7 % (ref 3–12)
NEUTROS ABS: 8.3 10*3/uL — AB (ref 1.7–7.7)
NEUTROS PCT: 66 % (ref 43–77)
PLATELETS: 209 10*3/uL (ref 150–400)
RBC: 5.43 MIL/uL (ref 4.22–5.81)
RDW: 13.4 % (ref 11.5–15.5)
WBC: 12.5 10*3/uL — ABNORMAL HIGH (ref 4.0–10.5)

## 2015-01-12 LAB — LIPASE, BLOOD: LIPASE: 24 U/L (ref 22–51)

## 2015-01-12 MED ORDER — ONDANSETRON HCL 4 MG/2ML IJ SOLN
4.0000 mg | Freq: Once | INTRAMUSCULAR | Status: AC
  Administered 2015-01-12: 4 mg via INTRAVENOUS
  Filled 2015-01-12: qty 2

## 2015-01-12 MED ORDER — IOHEXOL 300 MG/ML  SOLN
100.0000 mL | Freq: Once | INTRAMUSCULAR | Status: AC | PRN
  Administered 2015-01-12: 100 mL via INTRAVENOUS

## 2015-01-12 MED ORDER — IOHEXOL 300 MG/ML  SOLN: 25.0000 mL | Freq: Once | INTRAMUSCULAR | Status: AC | PRN

## 2015-01-12 MED ORDER — SODIUM CHLORIDE 0.9 % IV BOLUS (SEPSIS)
1000.0000 mL | Freq: Once | INTRAVENOUS | Status: AC
  Administered 2015-01-12: 1000 mL via INTRAVENOUS

## 2015-01-12 MED ORDER — PROMETHAZINE HCL 25 MG/ML IJ SOLN
12.5000 mg | Freq: Once | INTRAMUSCULAR | Status: AC
  Administered 2015-01-12: 12.5 mg via INTRAVENOUS
  Filled 2015-01-12: qty 1

## 2015-01-12 MED ORDER — SODIUM CHLORIDE 0.9 % IV SOLN
INTRAVENOUS | Status: DC
  Administered 2015-01-12: 23:00:00 via INTRAVENOUS

## 2015-01-12 MED ORDER — HYDROMORPHONE HCL 1 MG/ML IJ SOLN
1.0000 mg | Freq: Once | INTRAMUSCULAR | Status: AC
  Administered 2015-01-12: 1 mg via INTRAVENOUS
  Filled 2015-01-12: qty 1

## 2015-01-12 MED ORDER — PROMETHAZINE HCL 25 MG/ML IJ SOLN
12.5000 mg | Freq: Once | INTRAMUSCULAR | Status: AC
  Administered 2015-01-13: 12.5 mg via INTRAVENOUS
  Filled 2015-01-12: qty 1

## 2015-01-12 NOTE — ED Provider Notes (Signed)
CSN: [id number]     Arrival date & time 01/12/15  2009 History  This chart was scribed for Vanetta Mulders, MD by Phillis Haggis, ED Scribe. This patient was seen in room APA14/APA14 and patient care was started at 9:23 PM.    Chief Complaint  Patient presents with  . Nausea  . Chills   The history is provided by the patient and the spouse. No language interpreter was used.  HPI Comments: Joshua Lester is a 59 y.o. male who presents to the Emergency Department complaining of cramping generalized abdominal pain, rates 10/10, diaphoresis, nausea, vomiting, chills and diarrhea onset one day ago. Pt states that he had lower back surgery for ruptured discs and pinched nerves approximately 3 weeks ago at [facility] by Dr. Ophelia Charter. Has not taken any pain medication for the back surgery since 7 AM this morning. Denies hematemesis, fever, congestion, rhinorrhea, sore throat, cough, SOB, chest pain, visual disturbance, leg swelling, dysuria, hematuria, back pain, rash, bruising or bleeding easily, numbness, headaches, or confusion. Reports hx of gallbladder surgery.   Past Medical History  Diagnosis Date  . Lumbar herniated disc   . Medical history non-contributory    Past Surgical History  Procedure Laterality Date  . Facial reconstruction surgery    . Cholecystectomy  2011  . Lumbar laminectomy/decompression microdiscectomy N/A 12/20/2014    Procedure: L5-S1 Microdiscectomy, Bilateral;  Surgeon: Eldred Manges, MD;  Location: [facility] OR;  Service: Orthopedics;  Laterality: N/A;   History reviewed. No pertinent family history. History  Substance Use Topics  . Smoking status: Former Smoker -- 0.50 packs/day for 40 years    Types: Cigarettes  . Smokeless tobacco: Not on file     Comment: quit smoking 2011  . Alcohol Use: No     Comment: no ETOH for 25 years    Review of Systems  Constitutional: Positive for chills and diaphoresis. Negative for fever.  HENT: Negative for congestion, rhinorrhea  and sore throat.   Eyes: Negative for visual disturbance.  Respiratory: Negative for cough and shortness of breath.   Cardiovascular: Negative for chest pain and leg swelling.  Gastrointestinal: Positive for nausea, vomiting, abdominal pain and diarrhea.  Genitourinary: Negative for dysuria and hematuria.  Musculoskeletal: Negative for back pain.  Skin: Negative for rash.  Neurological: Negative for numbness and headaches.  Hematological: Does not bruise/bleed easily.  Psychiatric/Behavioral: Negative for confusion.   Allergies  Review of patient's allergies indicates no known allergies.  Home Medications   Prior to Admission medications   Medication Sig Start Date End Date Taking? Authorizing Provider  HYDROcodone-acetaminophen (NORCO) 10-325 MG per tablet Take 1 tablet by mouth every 8 (eight) hours as needed for moderate pain or severe pain.  01/11/15  Yes Historical Provider, MD  methocarbamol (ROBAXIN) 500 MG tablet Take 1 tablet (500 mg total) by mouth every 6 (six) hours as needed for muscle spasms. 12/20/14  Yes Naida Sleight, PA-C  oxyCODONE-acetaminophen (ROXICET) 5-325 MG per tablet Take 1-2 tablets by mouth every 6 (six) hours as needed for severe pain. 12/20/14  Yes Naida Sleight, PA-C  ondansetron (ZOFRAN ODT) 4 MG disintegrating tablet Take 1 tablet (4 mg total) by mouth every 8 (eight) hours as needed. 01/12/15   Vanetta Mulders, MD  promethazine (PHENERGAN) 25 MG tablet Take 1 tablet (25 mg total) by mouth every 6 (six) hours as needed. 01/12/15   Vanetta Mulders, MD   BP 104/83 mmHg  Pulse 76  Temp(Src) 98.8 F (37.1 C) (  Oral)  Resp 22  SpO2 100%  Physical Exam  Constitutional: He is oriented to person, place, and time. He appears well-developed and well-nourished.  HENT:  Head: Normocephalic and atraumatic.  Mouth/Throat: Oropharynx is clear and moist.  Eyes: Conjunctivae and EOM are normal. Pupils are equal, round, and reactive to light. No scleral icterus.  Neck:  Normal range of motion. Neck supple.  Cardiovascular: Normal rate and regular rhythm.   No murmur heard. Pulmonary/Chest: Effort normal.  Abdominal: Soft. He exhibits distension. Bowel sounds are decreased. There is no tenderness.  Musculoskeletal: Normal range of motion.  No ankle swelling; lower lumbar midline incision 5 cm, well healed, non-tender  Neurological: He is alert and oriented to person, place, and time. No cranial nerve deficit. He exhibits normal muscle tone. Coordination normal.  Skin: Skin is warm and dry. No rash noted.  Psychiatric: He has a normal mood and affect. His behavior is normal.  Nursing note and vitals reviewed.   ED Course  Procedures (including critical care time) DIAGNOSTIC STUDIES: Oxygen Saturation is 100% on RA, normal by my interpretation.    COORDINATION OF CARE: 9:36 PM-Discussed treatment plan which includes CT scan, pain and anti-nausea medication with pt at bedside and pt agreed to plan.   Labs Review Labs Reviewed  CBC WITH DIFFERENTIAL/PLATELET - Abnormal; Notable for the following:    WBC 12.5 (*)    Neutro Abs 8.3 (*)    All other components within normal limits  COMPREHENSIVE METABOLIC PANEL - Abnormal; Notable for the following:    Chloride 99 (*)    Glucose, Bld 153 (*)    Total Protein 9.0 (*)    AST 46 (*)    All other components within normal limits  LIPASE, BLOOD    Imaging Review Ct Abdomen Pelvis W Contrast  01/12/2015   CLINICAL DATA:  Acute onset of generalized abdominal pain and nausea. Diarrhea. Initial encounter.  EXAM: CT ABDOMEN AND PELVIS WITH CONTRAST  TECHNIQUE: Multidetector CT imaging of the abdomen and pelvis was performed using the standard protocol following bolus administration of intravenous contrast.  CONTRAST:  OMNIPAQUE IOHEXOL 300 MG/ML  SOLN  COMPARISON:  CT of the abdomen and pelvis from 03/07/2012, and MRI of the lumbar spine from 11/28/2014  FINDINGS: The visualized lung bases are clear.  Minimal wall thickening along the distal esophagus could reflect mild chronic inflammation.  The liver and spleen are unremarkable in appearance. The patient is status post cholecystectomy, with clips noted at the gallbladder fossa. The pancreas and adrenal glands are unremarkable.  The kidneys are unremarkable in appearance. There is no evidence of hydronephrosis. No renal or ureteral stones are seen. No perinephric stranding is appreciated.  No free fluid is identified. The small bowel is unremarkable in appearance. The stomach is within normal limits. No acute vascular abnormalities are seen. Scattered calcification is noted along the abdominal aorta and its branches.  The appendix is normal in caliber and contains air without evidence of appendicitis. Scattered diverticulosis is noted along the descending and sigmoid colon, without evidence of diverticulitis.  The bladder is mildly distended and grossly unremarkable. The prostate remains normal in size, with minimal calcification. No inguinal lymphadenopathy is seen.  No acute osseous abnormalities are identified. Prominent subcortical cystic change is noted at the anterior aspect of both femoral heads. Vacuum phenomenon and mild disc space narrowing are seen at L4-L5, with associated anterior and posterior osteophytes.  IMPRESSION: 1. No definite acute abnormality seen to explain the patient's  symptoms. 2. Scattered diverticulosis along the descending and sigmoid colon, without evidence of diverticulitis. 3. Minimal wall thickening along the distal esophagus could reflect mild chronic inflammation. 4. Scattered calcification along the abdominal aorta and its branches. 5. Prominent subcortical cystic change at the anterior aspect of both femoral heads.   Electronically Signed   By: Roanna RaiderJeffery  Chang M.D.   On: 01/12/2015 22:54     EKG Interpretation   Date/Time:  Wednesday January 12 2015 20:29:17 EDT Ventricular Rate:  84 PR Interval:  170 QRS Duration:  86 QT Interval:  387 QTC Calculation: 457 R Axis:   33 Text Interpretation:  Sinus rhythm Low voltage, extremity leads  Nonspecific T abnormalities, lateral leads No significant change since  last tracing Confirmed by Eirik Schueler  MD, Mickaela Starlin ([id number]) on 01/12/2015  8:58:29 PM      MDM   Final diagnoses:  Abdominal pain  Gastroenteritis   Patient improved significantly with pain medicine and antinausea medicine. Patient's just 2 weeks out from the back surgery done by Dr. Ophelia CharterYates. Patient had discectomy.    Patient without fever here the wound it looks well no redness no signs of infection no tenderness to palpation.  CT scan of the abdomen without evidence of any acute abnormalities.  We'll continue antinausea medicine and have the patient continue pain medicine. We'll have him check in with orthopedics just in case they're concerned about the wound. No ability to do MRI here tonight. Clinically I think it's unlikely there is any connection to the low part of the back. I personally performed the services described in this documentation, which was scribed in my presence. The recorded information has been reviewed and is accurate.     Vanetta MuldersScott Daleon Willinger, MD 01/13/15 0002

## 2015-01-12 NOTE — ED Notes (Signed)
Pt reporting chills, nausea, vomiting and diarrhea today.  States he isn't sure if he has had a fever or not.  Pt reports back surgery aprox 2 weeks ago.  Pt arrived to department visibly trembling and wearing a coat.  No fever during triage.

## 2015-01-13 ENCOUNTER — Observation Stay (HOSPITAL_BASED_OUTPATIENT_CLINIC_OR_DEPARTMENT_OTHER)
Admission: EM | Admit: 2015-01-13 | Discharge: 2015-01-15 | Disposition: A | Discharge status: Home / Self Care | Payer: BLUE CROSS/BLUE SHIELD | Source: Home / Self Care | Attending: Emergency Medicine | Admitting: Emergency Medicine

## 2015-01-13 ENCOUNTER — Encounter (HOSPITAL_COMMUNITY): Payer: Self-pay | Admitting: Emergency Medicine

## 2015-01-13 DIAGNOSIS — R1110 Vomiting, unspecified: Secondary | ICD-10-CM | POA: Diagnosis not present

## 2015-01-13 DIAGNOSIS — R7989 Other specified abnormal findings of blood chemistry: Secondary | ICD-10-CM | POA: Diagnosis not present

## 2015-01-13 DIAGNOSIS — R945 Abnormal results of liver function studies: Secondary | ICD-10-CM

## 2015-01-13 DIAGNOSIS — D72829 Elevated white blood cell count, unspecified: Secondary | ICD-10-CM | POA: Diagnosis not present

## 2015-01-13 DIAGNOSIS — Z9889 Other specified postprocedural states: Secondary | ICD-10-CM

## 2015-01-13 DIAGNOSIS — R509 Fever, unspecified: Secondary | ICD-10-CM | POA: Insufficient documentation

## 2015-01-13 DIAGNOSIS — R739 Hyperglycemia, unspecified: Secondary | ICD-10-CM | POA: Insufficient documentation

## 2015-01-13 DIAGNOSIS — K5790 Diverticulosis of intestine, part unspecified, without perforation or abscess without bleeding: Secondary | ICD-10-CM | POA: Insufficient documentation

## 2015-01-13 DIAGNOSIS — R112 Nausea with vomiting, unspecified: Secondary | ICD-10-CM | POA: Insufficient documentation

## 2015-01-13 HISTORY — DX: Diverticulosis of intestine, part unspecified, without perforation or abscess without bleeding: K57.90

## 2015-01-13 LAB — URINE MICROSCOPIC-ADD ON

## 2015-01-13 LAB — CBC WITH DIFFERENTIAL/PLATELET
Basophils Absolute: 0 10*3/uL (ref 0.0–0.1)
Basophils Relative: 0 % (ref 0–1)
EOS PCT: 0 % (ref 0–5)
Eosinophils Absolute: 0 10*3/uL (ref 0.0–0.7)
HCT: 45.1 % (ref 39.0–52.0)
HEMOGLOBIN: 15.9 g/dL (ref 13.0–17.0)
Lymphocytes Relative: 13 % (ref 12–46)
Lymphs Abs: 2.3 10*3/uL (ref 0.7–4.0)
MCH: 30.5 pg (ref 26.0–34.0)
MCHC: 35.3 g/dL (ref 30.0–36.0)
MCV: 86.4 fL (ref 78.0–100.0)
Monocytes Absolute: 0.6 10*3/uL (ref 0.1–1.0)
Monocytes Relative: 4 % (ref 3–12)
Neutro Abs: 14.3 10*3/uL — ABNORMAL HIGH (ref 1.7–7.7)
Neutrophils Relative %: 83 % — ABNORMAL HIGH (ref 43–77)
Platelets: 238 10*3/uL (ref 150–400)
RBC: 5.22 MIL/uL (ref 4.22–5.81)
RDW: 13.5 % (ref 11.5–15.5)
WBC: 17.2 10*3/uL — AB (ref 4.0–10.5)

## 2015-01-13 LAB — COMPREHENSIVE METABOLIC PANEL
ALT: 40 U/L (ref 17–63)
AST: 43 U/L — ABNORMAL HIGH (ref 15–41)
Albumin: 4.2 g/dL (ref 3.5–5.0)
Alkaline Phosphatase: 90 U/L (ref 38–126)
Anion gap: 12 (ref 5–15)
BILIRUBIN TOTAL: 1.4 mg/dL — AB (ref 0.3–1.2)
BUN: 13 mg/dL (ref 6–20)
CALCIUM: 9.7 mg/dL (ref 8.9–10.3)
CO2: 26 mmol/L (ref 22–32)
Chloride: 99 mmol/L — ABNORMAL LOW (ref 101–111)
Creatinine, Ser: 1.21 mg/dL (ref 0.61–1.24)
Glucose, Bld: 156 mg/dL — ABNORMAL HIGH (ref 65–99)
Potassium: 4 mmol/L (ref 3.5–5.1)
Sodium: 137 mmol/L (ref 135–145)
Total Protein: 8.9 g/dL — ABNORMAL HIGH (ref 6.5–8.1)

## 2015-01-13 LAB — URINALYSIS, ROUTINE W REFLEX MICROSCOPIC
Bilirubin Urine: NEGATIVE
GLUCOSE, UA: 100 mg/dL — AB
KETONES UR: NEGATIVE mg/dL
Leukocytes, UA: NEGATIVE
Nitrite: NEGATIVE
PROTEIN: 100 mg/dL — AB
SPECIFIC GRAVITY, URINE: 1.02 (ref 1.005–1.030)
Urobilinogen, UA: 0.2 mg/dL (ref 0.0–1.0)
pH: 6 (ref 5.0–8.0)

## 2015-01-13 MED ORDER — ENOXAPARIN SODIUM 40 MG/0.4ML ~~LOC~~ SOLN
40.0000 mg | SUBCUTANEOUS | Status: DC
  Administered 2015-01-13 – 2015-01-14 (×2): 40 mg via SUBCUTANEOUS
  Filled 2015-01-13 (×2): qty 0.4

## 2015-01-13 MED ORDER — ACETAMINOPHEN 650 MG RE SUPP: 650.0000 mg | Freq: Four times a day (QID) | RECTAL | Status: DC | PRN

## 2015-01-13 MED ORDER — PROMETHAZINE HCL 25 MG/ML IJ SOLN
25.0000 mg | Freq: Once | INTRAMUSCULAR | Status: AC
  Administered 2015-01-13: 25 mg via INTRAVENOUS
  Filled 2015-01-13: qty 1

## 2015-01-13 MED ORDER — SODIUM CHLORIDE 0.9 % IV BOLUS (SEPSIS)
1000.0000 mL | Freq: Once | INTRAVENOUS | Status: AC
  Administered 2015-01-13: 1000 mL via INTRAVENOUS

## 2015-01-13 MED ORDER — PROMETHAZINE HCL 12.5 MG PO TABS
12.5000 mg | ORAL_TABLET | Freq: Four times a day (QID) | ORAL | Status: DC | PRN
  Administered 2015-01-14 (×2): 12.5 mg via ORAL
  Filled 2015-01-13 (×2): qty 1

## 2015-01-13 MED ORDER — ACETAMINOPHEN 325 MG PO TABS
650.0000 mg | ORAL_TABLET | Freq: Four times a day (QID) | ORAL | Status: DC | PRN
  Administered 2015-01-13: 650 mg via ORAL
  Filled 2015-01-13: qty 2

## 2015-01-13 MED ORDER — PROMETHAZINE HCL 25 MG PO TABS: 25.0000 mg | ORAL_TABLET | Freq: Four times a day (QID) | ORAL | Status: DC | PRN

## 2015-01-13 MED ORDER — ONDANSETRON 4 MG PO TBDP: 4.0000 mg | ORAL_TABLET | Freq: Three times a day (TID) | ORAL | Status: DC | PRN

## 2015-01-13 MED ORDER — MORPHINE SULFATE 2 MG/ML IJ SOLN
2.0000 mg | Freq: Once | INTRAMUSCULAR | Status: AC
  Administered 2015-01-13: 2 mg via INTRAVENOUS
  Filled 2015-01-13: qty 1

## 2015-01-13 MED ORDER — SODIUM CHLORIDE 0.9 % IV SOLN
INTRAVENOUS | Status: DC
  Administered 2015-01-13 – 2015-01-15 (×6): via INTRAVENOUS

## 2015-01-13 NOTE — H&P (Signed)
[facility] History and Physical  Joshua Lester UXL:[id number] DOB: Sep 01, 1955 DOA: 01/13/2015  Referring physician: ray PCP: No PCP Per Patient   Chief Complaint: Persistent nausea and vomiting  HPI: Joshua Lester is a 59 y.o. male past medical history for lumbar herniated disc status post discectomy 3 weeks ago, diverticulosis resents to the emergency department with chief complaint of persistent nausea and vomiting for 2 days. Patient reports 3 days ago developing diarrhea. He reports several watery stools denies dark stool or bright red blood. 2 days ago developed nausea and vomiting. He reports diarrhea subsided he had a formed stool yesterday but the nausea and vomiting has persisted. He reports vomiting at least 6 or 7 times a day over the last 2 days. He denies coffee ground emesis. He reports dry heaving as he's been unable to keep even water on his stomach. He went to orthopedic surgeon who referred him to the emergency department as there was no indication that his symptoms were related to his surgery and his surgical site was clean and dry. He denies chest pain palpitations shortness of breath. He denies abdominal pain headache dizziness syncope or near-syncope. He denies dysuria hematuria frequency or urgency.  Workup in the emergency department with the CT abdomen pelvis without acute abnormality some diverticulosis without diverticulitis, leukocytosis. Urinalysis unremarkable CT of the abdomen and pelvis reveal bilateral lung bases are clear.  In the emergency department has temperature of 99.2 he's hemodynamically stable and not hypoxic. He is provided with IV fluids or fainting and Phenergan.  Review of Systems:  10 point review of systems complete and all systems are negative except as indicated in the history of present illness.   Past Medical History  Diagnosis Date  . Lumbar herniated disc   . Medical history non-contributory   . Diverticulosis    Past  Surgical History  Procedure Laterality Date  . Facial reconstruction surgery    . Cholecystectomy  2011  . Lumbar laminectomy/decompression microdiscectomy N/A 12/20/2014    Procedure: L5-S1 Microdiscectomy, Bilateral;  Surgeon: Eldred Manges, MD;  Location: [facility] OR;  Service: Orthopedics;  Laterality: N/A;   Social History:  reports that he quit smoking about 3 years ago. His smoking use included Cigarettes. He has a 20 pack-year smoking history. He does not have any smokeless tobacco history on file. He reports that he uses illicit drugs (Marijuana). He reports that he does not drink alcohol. 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 No pertinent family history. mother is alive with COPD father is alive but he's unaware of his medical history Prior to Admission medications   Medication Sig Start Date End Date Taking? Authorizing Provider  methocarbamol (ROBAXIN) 500 MG tablet Take 1 tablet (500 mg total) by mouth every 6 (six) hours as needed for muscle spasms. 12/20/14  Yes Naida Sleight, PA-C  oxyCODONE-acetaminophen (ROXICET) 5-325 MG per tablet Take 1-2 tablets by mouth every 6 (six) hours as needed for severe pain. 12/20/14  Yes Naida Sleight, PA-C   Physical Exam: Filed Vitals:   01/13/15 1032 01/13/15 1243 01/13/15 1451 01/13/15 1517  BP: 192/96 128/66 150/85 129/64  Pulse: 78 81 75 77  Temp: 99.4 F (37.4 C) 99.2 F (37.3 C) 98.9 F (37.2 C) 100.3 F (37.9 C)  TempSrc: Oral Oral Oral Oral  Resp: 18 18 16    Height: 5\' 7"  (1.702  m)   5\' 7"  (1.702 m)  Weight: 88.451 kg (195 lb)   88.451 kg (195 lb)  SpO2: 98% 98% 95% 97%    Wt Readings from Last 3 Encounters:  01/13/15 88.451 kg (195 lb)  12/20/14 89.812 kg (198 lb)  12/16/14 89.903 kg (198 lb 3.2 oz)    General:  Appears calm and  somewhat uncomfortable Eyes: PERRL, normal lids, irises & conjunctiva ENT: grossly  normal hearing, his membranes of his mouth are pink but dry Neck: no LAD, masses or thyromegaly Cardiovascular: RRR, no m/r/g. No LE edema.  Respiratory: CTA bilaterally, no w/r/r. Normal respiratory effort. Abdomen: soft, ntnd has a bowel sounds, no guarding or rebounding Skin: no rash or induration seen on limited exam Musculoskeletal: grossly normal tone BUE/BLE Psychiatric: grossly normal mood and affect, speech fluent and appropriate Neurologic: grossly non-focal. each clear facial symmetry           Labs on Admission:  Basic Metabolic Panel:  Recent Labs Lab 01/12/15 2033 01/13/15 1055  NA 136 137  K 4.1 4.0  CL 99* 99*  CO2 25 26  GLUCOSE 153* 156*  BUN 14 13  CREATININE 1.09 1.21  CALCIUM 9.7 9.7   Liver Function Tests:  Recent Labs Lab 01/12/15 2033 01/13/15 1055  AST 46* 43*  ALT 41 40  ALKPHOS 95 90  BILITOT 1.2 1.4*  PROT 9.0* 8.9*  ALBUMIN 4.2 4.2    Recent Labs Lab 01/12/15 2033  LIPASE 24   No results for input(s): AMMONIA in the last 168 hours. CBC:  Recent Labs Lab 01/12/15 2033 01/13/15 1055  WBC 12.5* 17.2*  NEUTROABS 8.3* 14.3*  HGB 16.7 15.9  HCT 46.9 45.1  MCV 86.4 86.4  PLT 209 238   Cardiac Enzymes: No results for input(s): CKTOTAL, CKMB, CKMBINDEX, TROPONINI in the last 168 hours.  BNP (last 3 results) No results for input(s): BNP in the last 8760 hours.  ProBNP (last 3 results) No results for input(s): PROBNP in the last 8760 hours.  CBG: No results for input(s): GLUCAP in the last 168 hours.  Radiological Exams on Admission: Ct Abdomen Pelvis W Contrast  01/12/2015   CLINICAL DATA:  Acute onset of generalized abdominal pain and nausea. Diarrhea. Initial encounter.  EXAM: CT ABDOMEN AND PELVIS WITH CONTRAST  TECHNIQUE: Multidetector CT imaging of the abdomen and pelvis was performed using the standard protocol following bolus administration of intravenous contrast.  CONTRAST:  OMNIPAQUE IOHEXOL 300 MG/ML  SOLN   COMPARISON:  CT of the abdomen and pelvis from 03/07/2012, and MRI of the lumbar spine from 11/28/2014  FINDINGS: The visualized lung bases are clear. Minimal wall thickening along the distal esophagus could reflect mild chronic inflammation.  The liver and spleen are unremarkable in appearance. The patient is status post cholecystectomy, with clips noted at the gallbladder fossa. The pancreas and adrenal glands are unremarkable.  The kidneys are unremarkable in appearance. There is no evidence of hydronephrosis. No renal or ureteral stones are seen. No perinephric stranding is appreciated.  No free fluid is identified. The small bowel is unremarkable in appearance. The stomach is within normal limits. No acute vascular abnormalities are seen. Scattered calcification is noted along the abdominal aorta and its branches.  The appendix is normal in caliber and contains air without evidence of appendicitis. Scattered diverticulosis is noted along the descending and sigmoid colon, without evidence of diverticulitis.  The bladder is mildly distended and grossly unremarkable. The prostate remains normal in size,  with minimal calcification. No inguinal lymphadenopathy is seen.  No acute osseous abnormalities are identified. Prominent subcortical cystic change is noted at the anterior aspect of both femoral heads. Vacuum phenomenon and mild disc space narrowing are seen at L4-L5, with associated anterior and posterior osteophytes.  IMPRESSION: 1. No definite acute abnormality seen to explain the patient's symptoms. 2. Scattered diverticulosis along the descending and sigmoid colon, without evidence of diverticulitis. 3. Minimal wall thickening along the distal esophagus could reflect mild chronic inflammation. 4. Scattered calcification along the abdominal aorta and its branches. 5. Prominent subcortical cystic change at the anterior aspect of both femoral heads.   Electronically Signed   By: Roanna Raider M.D.   On:  01/12/2015 22:54    EKG:   Assessment/Plan Principal Problem:   Intractable nausea and vomiting: Etiology unclear. Possible gastroenteritis. CT of the abdomen pelvis unremarkable. He does have fever 99.2 white count. Will admit for supportive therapy. Will obtain stool specimen as able even though he reports diarrhea is resolved. IV fluids and Phenergan. Bowel rest for now.  Active Problems: Diverticulosis: Per CT. No diverticulitis. No bright red blood per rectum or melanoma per patient. No abdominal pain.    Fever: Temp 99.2. Leukocytosis as well. May be reactive. Will get blood cultures if he spikes attempt. Urinalysis unremarkable CT of the abdomen yields lung bases clear. Incision lumbar spine clean and dry no swelling or erythema. Continue fluids as noted above. Will recheck in the morning    S/P lumbar discectomy: 3 weeks ago. See above         Code Status: full DVT Prophylaxis: Family Communication: significant other at bedside Disposition Plan: home hopefully 24 hours  Time spent: 60 minutes  [facility] Pager [phone]

## 2015-01-13 NOTE — Discharge Instructions (Signed)
Take antinausea medicine as directed. Can take both together. Continue pain medicine. Follow-up with the orthopedic office just in case is any concern for the back. Based on today's workup no real concern for infection in that area. However if fevers develop that concern would be increased drastically. Rest for the next 2 days clear liquid diet and advance to bland diet as tolerated.

## 2015-01-13 NOTE — ED Notes (Signed)
Pt reports seen for same this am in ED. Pt reports went to pcp this am and was told to come back to ED. Pt reports had CT scan completed this am. Pt reports diarrhea,loss of appetite,nausea, abdominal pain.

## 2015-01-13 NOTE — ED Notes (Signed)
Pt left ED, via wheelchair with no signs of distress. Pt verbalized discharge instructions. 

## 2015-01-13 NOTE — ED Notes (Signed)
Pt ready for transport. Report given to receiving RN.

## 2015-01-13 NOTE — ED Provider Notes (Signed)
CSN: [id number]     Arrival date & time 01/13/15  1018 History   This chart was scribed for Margarita Grizzle, MD by Murriel Hopper, ED Scribe. This patient was seen in room APA11/APA11 and the patient's care was started at 11:13 AM.  Chief Complaint  Patient presents with  . Abdominal Pain      Patient is a 59 y.o. male presenting with abdominal pain. The history is provided by the patient. No language interpreter was used.  Abdominal Pain Pain location:  Generalized Pain severity:  Moderate Onset quality:  Sudden Duration:  2 days Timing:  Constant Progression:  Worsening Relieved by:  None tried Worsened by:  Eating Associated symptoms: cough, diarrhea, nausea, shortness of breath and vomiting   Associated symptoms: no fever      HPI Comments: Joshua Lester is a 59 y.o. male who presents to the Emergency Department complaining of constant, worsening abdominal pain with associated vomiting, diarrhea, cough, SOB that has been present for 2 days. Pt reports that he had a back operation 3 weeks ago, and went to his specialists office this morning to see if his symptoms were related to his back surgery, which he was told were not. Pt then came to ED. Pt reports the onset of his symptoms began with diarrhea two days ago and then he began to vomit yesterday. Pt reports a productive cough with yellow sputum as well. Pt denies blood in his stool. Subjective fever and chills.       Past Medical History  Diagnosis Date  . Lumbar herniated disc   . Medical history non-contributory    Past Surgical History  Procedure Laterality Date  . Facial reconstruction surgery    . Cholecystectomy  2011  . Lumbar laminectomy/decompression microdiscectomy N/A 12/20/2014    Procedure: L5-S1 Microdiscectomy, Bilateral;  Surgeon: Eldred Manges, MD;  Location: [facility] OR;  Service: Orthopedics;  Laterality: N/A;   History reviewed. No pertinent family history. History  Substance Use Topics  . Smoking  status: Former Smoker -- 0.50 packs/day for 40 years    Types: Cigarettes  . Smokeless tobacco: Not on file     Comment: quit smoking 2011  . Alcohol Use: No     Comment: no ETOH for 25 years    Review of Systems  Constitutional: Negative for fever.  Respiratory: Positive for cough and shortness of breath.   Gastrointestinal: Positive for nausea, vomiting, abdominal pain and diarrhea.  Endocrine: Positive for heat intolerance.  All other systems reviewed and are negative.     Allergies  Review of patient's allergies indicates no known allergies.  Home Medications   Prior to Admission medications   Medication Sig Start Date End Date Taking? Authorizing Provider  HYDROcodone-acetaminophen (NORCO) 10-325 MG per tablet Take 1 tablet by mouth every 8 (eight) hours as needed for moderate pain or severe pain.  01/11/15   Historical Provider, MD  methocarbamol (ROBAXIN) 500 MG tablet Take 1 tablet (500 mg total) by mouth every 6 (six) hours as needed for muscle spasms. 12/20/14   Naida Sleight, PA-C  ondansetron (ZOFRAN ODT) 4 MG disintegrating tablet Take 1 tablet (4 mg total) by mouth every 8 (eight) hours as needed. 01/12/15   Vanetta Mulders, MD  oxyCODONE-acetaminophen (ROXICET) 5-325 MG per tablet Take 1-2 tablets by mouth every 6 (six) hours as needed for severe pain. 12/20/14   Naida Sleight, PA-C  promethazine (PHENERGAN) 25 MG tablet Take 1 tablet (25 mg total) by mouth  every 6 (six) hours as needed. 01/12/15   Vanetta Mulders, MD   BP 192/96 mmHg  Pulse 78  Temp(Src) 99.4 F (37.4 C) (Oral)  Resp 18  Ht 5\' 7"  (1.702 m)  Wt 195 lb (88.451 kg)  BMI 30.53 kg/m2  SpO2 98% Physical Exam  Constitutional: He is oriented to person, place, and time. He appears well-developed and well-nourished. He appears distressed.  Uncomfortable appearing male  HENT:  Head: Normocephalic and atraumatic.  Right Ear: External ear normal.  Left Ear: External ear normal.  Nose: Nose normal.   Mouth/Throat: Oropharynx is clear and moist.  Eyes: Conjunctivae are normal. Pupils are equal, round, and reactive to light.  Neck: Normal range of motion. Neck supple.  Cardiovascular: Normal rate and regular rhythm.   Pulmonary/Chest: Effort normal.  Abdominal: Soft. Bowel sounds are normal. There is no tenderness.  Musculoskeletal: Normal range of motion.  Neurological: He is alert and oriented to person, place, and time. He has normal reflexes.  Skin: Skin is warm and dry.  Psychiatric: He has a normal mood and affect. His behavior is normal. Judgment and thought content normal.  Nursing note and vitals reviewed.   ED Course  Procedures (including critical care time)  DIAGNOSTIC STUDIES: Oxygen Saturation is 98% on room air, normal by my interpretation.    COORDINATION OF CARE: 11:17 AM Discussed treatment plan with pt at bedside and pt agreed to plan.   Labs Review Labs Reviewed - No data to display  Imaging Review Ct Abdomen Pelvis W Contrast  01/12/2015   CLINICAL DATA:  Acute onset of generalized abdominal pain and nausea. Diarrhea. Initial encounter.  EXAM: CT ABDOMEN AND PELVIS WITH CONTRAST  TECHNIQUE: Multidetector CT imaging of the abdomen and pelvis was performed using the standard protocol following bolus administration of intravenous contrast.  CONTRAST:  OMNIPAQUE IOHEXOL 300 MG/ML  SOLN  COMPARISON:  CT of the abdomen and pelvis from 03/07/2012, and MRI of the lumbar spine from 11/28/2014  FINDINGS: The visualized lung bases are clear. Minimal wall thickening along the distal esophagus could reflect mild chronic inflammation.  The liver and spleen are unremarkable in appearance. The patient is status post cholecystectomy, with clips noted at the gallbladder fossa. The pancreas and adrenal glands are unremarkable.  The kidneys are unremarkable in appearance. There is no evidence of hydronephrosis. No renal or ureteral stones are seen. No perinephric stranding is  appreciated.  No free fluid is identified. The small bowel is unremarkable in appearance. The stomach is within normal limits. No acute vascular abnormalities are seen. Scattered calcification is noted along the abdominal aorta and its branches.  The appendix is normal in caliber and contains air without evidence of appendicitis. Scattered diverticulosis is noted along the descending and sigmoid colon, without evidence of diverticulitis.  The bladder is mildly distended and grossly unremarkable. The prostate remains normal in size, with minimal calcification. No inguinal lymphadenopathy is seen.  No acute osseous abnormalities are identified. Prominent subcortical cystic change is noted at the anterior aspect of both femoral heads. Vacuum phenomenon and mild disc space narrowing are seen at L4-L5, with associated anterior and posterior osteophytes.  IMPRESSION: 1. No definite acute abnormality seen to explain the patient's symptoms. 2. Scattered diverticulosis along the descending and sigmoid colon, without evidence of diverticulitis. 3. Minimal wall thickening along the distal esophagus could reflect mild chronic inflammation. 4. Scattered calcification along the abdominal aorta and its branches. 5. Prominent subcortical cystic change at the anterior aspect of  both femoral heads.   Electronically Signed   By: Roanna Raider M.D.   On: 01/12/2015 22:54     MDM   Final diagnoses:  Intractable vomiting with nausea, vomiting of unspecified type    This a 59 year old male with nausea vomiting and diarrhea. His continued to have severe nausea and vomiting. He was evaluated here yesterday including with a CT scan that showed no evidence of acute intra-abdominal abnormality. He continued to feel poorly during the night and was seen by his orthopedic surgeon today to be evaluated for any related surgical infections. He was told that there was no evidence that there is any problem with the surgical site or  infection related to surgery. He continued to vomit and was brought back into the emergency department. Here he has had IV fluids and antibiotics and pain medicine. He continues to be very comfortable with nausea although he is not actively vomiting now. Plan observation for IV hydration and antibiotics.  I personally performed the services described in this documentation, which was scribed in my presence. The recorded information has been reviewed and considered.   Margarita Grizzle, MD 01/13/15 [phone]

## 2015-01-14 ENCOUNTER — Encounter (HOSPITAL_COMMUNITY): Payer: Self-pay | Admitting: Family Medicine

## 2015-01-14 DIAGNOSIS — R7989 Other specified abnormal findings of blood chemistry: Secondary | ICD-10-CM

## 2015-01-14 DIAGNOSIS — D72829 Elevated white blood cell count, unspecified: Secondary | ICD-10-CM | POA: Diagnosis not present

## 2015-01-14 DIAGNOSIS — R1110 Vomiting, unspecified: Secondary | ICD-10-CM | POA: Diagnosis not present

## 2015-01-14 DIAGNOSIS — R945 Abnormal results of liver function studies: Secondary | ICD-10-CM

## 2015-01-14 LAB — COMPREHENSIVE METABOLIC PANEL
ALT: 50 U/L (ref 17–63)
AST: 50 U/L — ABNORMAL HIGH (ref 15–41)
Albumin: 3.4 g/dL — ABNORMAL LOW (ref 3.5–5.0)
Alkaline Phosphatase: 71 U/L (ref 38–126)
Anion gap: 7 (ref 5–15)
BUN: 12 mg/dL (ref 6–20)
CO2: 25 mmol/L (ref 22–32)
Calcium: 8.7 mg/dL — ABNORMAL LOW (ref 8.9–10.3)
Chloride: 103 mmol/L (ref 101–111)
Creatinine, Ser: 0.94 mg/dL (ref 0.61–1.24)
Glucose, Bld: 145 mg/dL — ABNORMAL HIGH (ref 65–99)
POTASSIUM: 3.7 mmol/L (ref 3.5–5.1)
Sodium: 135 mmol/L (ref 135–145)
Total Bilirubin: 1.4 mg/dL — ABNORMAL HIGH (ref 0.3–1.2)
Total Protein: 7.2 g/dL (ref 6.5–8.1)

## 2015-01-14 LAB — CBC
HCT: 42.6 % (ref 39.0–52.0)
HEMOGLOBIN: 14.9 g/dL (ref 13.0–17.0)
MCH: 30.2 pg (ref 26.0–34.0)
MCHC: 35 g/dL (ref 30.0–36.0)
MCV: 86.4 fL (ref 78.0–100.0)
Platelets: 202 10*3/uL (ref 150–400)
RBC: 4.93 MIL/uL (ref 4.22–5.81)
RDW: 13.4 % (ref 11.5–15.5)
WBC: 16.8 10*3/uL — ABNORMAL HIGH (ref 4.0–10.5)

## 2015-01-14 LAB — HEMOGLOBIN A1C
Hgb A1c MFr Bld: 6.5 % — ABNORMAL HIGH (ref 4.8–5.6)
Mean Plasma Glucose: 140 mg/dL

## 2015-01-14 MED ORDER — PROMETHAZINE HCL 25 MG/ML IJ SOLN
25.0000 mg | Freq: Four times a day (QID) | INTRAMUSCULAR | Status: DC | PRN
  Administered 2015-01-14: 25 mg via INTRAVENOUS
  Filled 2015-01-14: qty 1

## 2015-01-14 MED ORDER — ONDANSETRON HCL 4 MG/2ML IJ SOLN: 4.0000 mg | Freq: Four times a day (QID) | INTRAMUSCULAR | Status: DC | PRN

## 2015-01-14 MED ORDER — ZOLPIDEM TARTRATE 5 MG PO TABS
5.0000 mg | ORAL_TABLET | Freq: Every evening | ORAL | Status: DC | PRN
  Administered 2015-01-14: 5 mg via ORAL
  Filled 2015-01-14 (×2): qty 1

## 2015-01-14 MED ORDER — ONDANSETRON HCL 4 MG/2ML IJ SOLN
4.0000 mg | Freq: Four times a day (QID) | INTRAMUSCULAR | Status: DC | PRN
  Administered 2015-01-14: 4 mg via INTRAVENOUS
  Filled 2015-01-14: qty 2

## 2015-01-14 MED ORDER — PANTOPRAZOLE SODIUM 40 MG PO TBEC
40.0000 mg | DELAYED_RELEASE_TABLET | Freq: Every day | ORAL | Status: DC
  Administered 2015-01-15: 40 mg via ORAL
  Filled 2015-01-14: qty 1

## 2015-01-14 NOTE — Progress Notes (Addendum)
PROGRESS NOTE  Joshua Lester ZOX:[id number] DOB: December 25, 1955 DOA: 01/13/2015 PCP: No PCP Per Patient  Summary: 59--year-old man without significant past medical history who recently had discectomy lumbar spine without complication and presented to the emergency department with nausea and vomiting. He had some diarrhea several days ago but this has resolved. He was noted to have low-grade temperature. Chemistry panel is essentially unremarkable. WBC was elevated at 17.2. Urinalysis was unremarkable and CT abdomen and pelvis without acute findings although some distal thickening of the esophagus was mentioned.   Assessment/Plan: 1. Intractable nausea and vomiting, suspectable viral process. Resolved. CT abdomen and pelvis unremarkable. Poor appetite. 2. Leukocytosis. Etiology unclear; somewhat improved. No evidence of infection. 3. Minimal wall thickening of distal esophagus seen on CT could reflect mild chronic inflammation.  4. Elevated AST and total bilirubin, minimally likely related to acute process. Will follow clinically.    Overall appears better. Advance diet.   Add PPI. Outpatient GI followup.  Continue IVF  Check CMP and BMP in the morning.  Anticipate discharge within the next 24 hours.  Code Status: Full DVT prophylaxis: Lovenox Family Communication: Wife bedside, updated on plan in detail Disposition Plan: Discharge patient home tomorrow once he has tolerated lunch and dinner tonight.   Brendia Sacks, MD  [facility]  Pager [phone] If 7PM-7AM, please contact night-coverage at [email or web address], password [facility] 01/14/2015, 10:39 AM    Consultants:    Procedures:    Antibiotics:    HPI/Subjective:  Pt reports hot and cold flashes intermittently. Still having mild diarrhea with one episode today.Nausea intermittently but was able to eat a small piece of toast this morning. No reports of pain, shortness of breath, vomiting, or abd pain. Overall feeling  better  Objective: Filed Vitals:   01/13/15 1517 01/13/15 2002 01/14/15 0016 01/14/15 0519  BP: 129/64 121/68 138/76 128/76  Pulse: 77 72 67 73  Temp: 100.3 F (37.9 C) 98.8 F (37.1 C) 98.7 F (37.1 C) 99.1 F (37.3 C)  TempSrc: Oral Oral Oral Oral  Resp:  Height:  (1.702 m)     Weight: 88.451 kg (195 lb)     SpO2: 97% 95% 98% 100%    Intake/Output Summary (Last 24 hours) at 01/14/15 1039 Last data filed at 01/14/15 0900  Gross per 24 hour  Intake 2415.83 ml  Output   1300 ml  Net 1115.83 ml     Filed Weights   01/13/15 1032 01/13/15 1517  Weight: 88.451 kg (195 lb) 88.451 kg (195 lb)    Exam:     Tmax 100.3 VSS otherwise. General:  Appears better, calm, and comfortable Neck: no LAD, masses or thyromegaly Cardiovascular: RRR, no m/r/g. No LE edema. Respiratory: CTA bilaterally, no w/r/r. Normal respiratory effort. Abdomen: soft, ntnd, no RUQ pain Musculoskeletal: grossly normal tone BUE/BLE Psychiatric: grossly normal mood and affect, speech fluent and appropriate  New data reviewed:  BMP unremarkable   Total bilirubin without significant change, 1.4   AST without significant change, 50   WBC without significant change 16.8  Pertinent data since admission:  Unremarkable UA  CT A&P without acute abnormality, question of mild inflammation of distal esophagus.   Pending data:  GI pathogen panel  Scheduled Meds: . enoxaparin (LOVENOX) injection  40 mg Subcutaneous Q24H   Continuous Infusions: . sodium chloride 125 mL/hr at 01/14/15 0805    Principal Problem:   Intractable nausea and vomiting Active Problems:   S/P lumbar discectomy   Leukocytosis  Elevated LFTs   Time spent 25 minutes   I, Jessica D. Jari Pigg, am scribing for Dr. Melton Alar. Irene Limbo. MD on 01/14/2015 at 7:16 AM  I have reviewed the above documentation for accuracy and completeness, and I agree with the above. Brendia Sacks, MD

## 2015-01-14 NOTE — Care Management Note (Signed)
Case Management Note  Patient Details  Name: Joshua Lester MRN: [id number] Date of Birth: [birth year]  Expected Discharge Date:  01/14/15               Expected Discharge Plan:  Home/Self Care  In-House Referral:  NA  Discharge planning Services  CM Consult  Post Acute Care Choice:  NA Choice offered to:  NA  DME Arranged:    DME Agency:     HH Arranged:    HH Agency:     Status of Service:  Completed, signed off  Medicare Important Message Given:    Date Medicare IM Given:    Medicare IM give by:    Date Additional Medicare IM Given:    Additional Medicare Important Message give by:     If discussed at Long Length of Stay Meetings, dates discussed:    Additional Comments: Pt is from home and independent at baseline. Pt has no HH services or DME's prior to admission. Pt see's orthopedist but has no PCP. Pt has requested assistance with finding a PCP. Pt lives in [place]. [facility] called at patient's requests. The PA and NP in the practice are accepting new patients. Pt will be given facilities number and may call at his convenience to make appointment. No further CM needs at this time.  Malcolm Metro, RN 01/14/2015, 2:43 PM

## 2015-01-14 NOTE — Progress Notes (Signed)
Patient complains of nausea and vomiting. Phenrgran 12.5mg  PO given with some effect. Patient rquested something by IV. Dr. Irene Limbo notified.

## 2015-01-14 NOTE — Progress Notes (Signed)
Patient vomited phenergan tablet. MD notified. Order placed for IV phenergan. Will continue to monitor.

## 2015-01-14 NOTE — Discharge Instructions (Signed)
You may call [facility] to make appointment with the PA or NP for PCP care.   [facility] - [phone]

## 2015-01-15 DIAGNOSIS — R1110 Vomiting, unspecified: Secondary | ICD-10-CM | POA: Diagnosis not present

## 2015-01-15 DIAGNOSIS — R7989 Other specified abnormal findings of blood chemistry: Secondary | ICD-10-CM | POA: Diagnosis not present

## 2015-01-15 DIAGNOSIS — D72829 Elevated white blood cell count, unspecified: Secondary | ICD-10-CM | POA: Diagnosis not present

## 2015-01-15 LAB — COMPREHENSIVE METABOLIC PANEL
ALT: 60 U/L (ref 17–63)
AST: 50 U/L — AB (ref 15–41)
Albumin: 3.6 g/dL (ref 3.5–5.0)
Alkaline Phosphatase: 71 U/L (ref 38–126)
Anion gap: 8 (ref 5–15)
BUN: 11 mg/dL (ref 6–20)
CALCIUM: 8.8 mg/dL — AB (ref 8.9–10.3)
CO2: 26 mmol/L (ref 22–32)
Chloride: 101 mmol/L (ref 101–111)
Creatinine, Ser: 0.85 mg/dL (ref 0.61–1.24)
GFR calc non Af Amer: >60 mL/min (ref >60)
GLUCOSE: 108 mg/dL — AB (ref 65–99)
Potassium: 3.5 mmol/L (ref 3.5–5.1)
Sodium: 135 mmol/L (ref 135–145)
TOTAL PROTEIN: 7.7 g/dL (ref 6.5–8.1)
Total Bilirubin: 1.8 mg/dL — ABNORMAL HIGH (ref 0.3–1.2)

## 2015-01-15 LAB — CBC
HCT: 45.1 % (ref 39.0–52.0)
Hemoglobin: 15.8 g/dL (ref 13.0–17.0)
MCH: 30 pg (ref 26.0–34.0)
MCHC: 35 g/dL (ref 30.0–36.0)
MCV: 85.7 fL (ref 78.0–100.0)
Platelets: 186 10*3/uL (ref 150–400)
RBC: 5.26 MIL/uL (ref 4.22–5.81)
RDW: 13 % (ref 11.5–15.5)
WBC: 13.4 10*3/uL — ABNORMAL HIGH (ref 4.0–10.5)

## 2015-01-15 MED ORDER — FAMOTIDINE 20 MG PO TABS: 20.0000 mg | ORAL_TABLET | Freq: Every day | ORAL | Status: DC

## 2015-01-15 NOTE — Progress Notes (Signed)
Patient with orders to be discharge home. Discharge instructions given, patient verbalized understanding. Prescriptions given. Patient stable. Patient left in private vehicle with family.  

## 2015-01-15 NOTE — Progress Notes (Addendum)
PROGRESS NOTE  Joshua Lester ZOX:[id number] DOB: 1956/03/31 DOA: 01/13/2015 PCP: No PCP Per Patient  Summary: 59--year-old man without significant past medical history who recently had discectomy lumbar spine without complication and presented to the emergency department with nausea and vomiting. He had some diarrhea several days ago but this has resolved. He was noted to have low-grade temperature. Chemistry panel is essentially unremarkable. WBC was elevated at 17.2. Urinalysis was unremarkable and CT abdomen and pelvis without acute findings although some distal thickening of the esophagus was mentioned.   Assessment/Plan: 1. Intractable nausea and vomiting, suspectable viral process. Resolved. CT abdomen and pelvis unremarkable. Appetite poor. 2. Leukocytosis. Etiology unclear; continues to improve. No evidence of infection. 3. Minimal wall thickening of distal esophagus seen on CT could reflect mild chronic inflammation.  4. Elevated AST and total bilirubin, minimally likely related to acute process. Follow clinically.    Overall better, vomiting resolved   Monitor breakfast and lunch intake and discharge home if diet is tolerated.  Discontinue IVF   We discussed esophageal CT findings with pt and wife at bedside, given clinical improvement, will defer further evaluation to outpatient setting. Doubt significant.  Code Status: Full DVT prophylaxis: Lovenox Family Communication: Wife bedside, updated on plan 01/15/15. Disposition Plan: Discharge home   Brendia Sacks, MD  [facility]  Pager [phone] If 7PM-7AM, please contact night-coverage at [email or web address], password [facility] 01/15/2015, 7:01 AM    Consultants:    Procedures:    Antibiotics:    HPI/Subjective: Pt reports feeling better and able to sleep last night. Denies nausea, vomiting, diarrhea, or SOB this morning. He did vomit yesterday afternoon and was not able to eat dinner. He has not woken up yet to  attempt to eat breakfast, but has not had much of an appetite. He reports still belching intermittently, but no history of GERD. He hopes to go home today.  Objective: Filed Vitals:   01/14/15 1544 01/14/15 1832 01/14/15 2157 01/15/15 0619  BP: 146/90 150/75 144/75 140/80  Pulse: 61 67 64 73  Temp: 98.9 F (37.2 C) 98.7 F (37.1 C) 98.9 F (37.2 C) 99.4 F (37.4 C)  TempSrc: Oral Oral Oral Oral  Resp: 17 18 16 18   Height:      Weight:      SpO2: 98% 94% 96% 94%    Intake/Output Summary (Last 24 hours) at 01/15/15 0701 Last data filed at 01/15/15 7829  Gross per 24 hour  Intake 3438.33 ml  Output   3600 ml  Net -161.67 ml     Filed Weights   01/13/15 1032 01/13/15 1517  Weight: 88.451 kg (195 lb) 88.451 kg (195 lb)    Exam:     Afebrile. VSS otherwise. General:  Appears calm and comfortable Cardiovascular: RRR, no m/r/g. No LE edema. Respiratory: CTA bilaterally, no w/r/r. Normal respiratory effort. Abdomen: soft, ntnd, normal bowel sounds, skin of abdomen looks normal.  Skin: low back incision healing very well, no erythema. Musculoskeletal: grossly normal tone BUE/BLE Psychiatric: grossly normal mood and affect, speech fluent and appropriate Neurologic: grossly non-focal.   New data reviewed:  WBC trending down, 13.4.  BMP unremarkable  Total bilirubin and AST no significant change.   Pertinent data since admission:  Unremarkable UA  CT A&P without acute abnormality, question of mild inflammation of distal esophagus.   Min elevation of bilirubin, AST 50  Pending data:  GI pathogen panel  Stool cx  Scheduled Meds: . enoxaparin (LOVENOX) injection  40 mg Subcutaneous Q24H  .  pantoprazole  40 mg Oral Daily   Continuous Infusions: . sodium chloride 125 mL/hr at 01/15/15 1610    Principal Problem:   Intractable nausea and vomiting Active Problems:   S/P lumbar discectomy   Leukocytosis   Elevated LFTs   Time spent 20 minutes   I,  Jessica D. Jari Pigg, am scribing for Dr. Melton Alar. Irene Limbo. MD on 12/25/2014 at 7:03 AM  I have reviewed the above documentation for accuracy and completeness, and I agree with the above 7/23. Brendia Sacks, MD

## 2015-01-15 NOTE — Discharge Summary (Signed)
Physician Discharge Summary  Joshua Lester Joshua Lester:[id number] DOB: 26-Mar-1956 DOA: 01/13/2015  PCP: No PCP Per Patient  Patient has insurance and will call [facility] to establish care  Admit date: 01/13/2015 Discharge date: 01/15/2015  Recommendations for Outpatient Follow-up:  1. Minimal wall thickening of distal esophagus, consider outpatient GI evaluation if has recurrent symptoms or develops GERD symptoms. 2. Mild elevation of AST and total bilirubin, consider repeat complete metabolic panel as an outpatient.   Discharge Diagnoses:  1. Intractable nausea and vomiting, suspected viral gastroenteritis 2. Minimal wall thickening of distal esophagus seen on CT of unclear significance 3. Elevated AST and total bilirubin, no history of alcohol use, likely secondary to acute process  Discharge Condition: Improved Disposition: Home  Diet recommendation: Regular  Filed Weights   01/13/15 1032 01/13/15 1517  Weight: 88.451 kg (195 lb) 88.451 kg (195 lb)    History of present illness:  59 year old man with recent spinal surgery presented with nausea and vomiting as well as history of diarrhea. Initial evaluation was notable only for WBC of 17.2. CT without acute findings. He was admitted for further evaluation of intractable nausea and vomiting.  Hospital Course:  He was treated with supportive care, antiemetics and IV fluids with gradual improvement in his condition. This hospitalization was uncomplicated. The time of discharge no nausea or vomiting. Tolerating food. No abdominal pain. Individual issues as below.  1. Intractable nausea and vomiting, suspectable viral process. Resolved. CT abdomen and pelvis unremarkable. Appetite poor. 2. Leukocytosis. Etiology unclear; continues to improve. No evidence of infection. 3. Minimal wall thickening of distal esophagus seen on CT could reflect mild chronic inflammation. He has no history of GERD symptoms normally. Significance  of this finding is unclear. 4. Elevated AST and total bilirubin, minimally likely related to acute process. Follow clinically as an outpatient.   We discussed esophageal CT findings with pt and wife at bedside, given clinical improvement, will defer further evaluation to outpatient setting. Doubt significant.  Discharge Instructions  Discharge Instructions    Activity as tolerated - No restrictions    Complete by:  As directed      Diet general    Complete by:  As directed      Discharge instructions    Complete by:  As directed   Call your physician or seek immediate medical attention for pain, fever, vomiting, abdominal pain or worsening of condition.          Discharge Medication List as of 01/15/2015  3:12 PM    START taking these medications   Details  famotidine (PEPCID) 20 MG tablet Take 1 tablet (20 mg total) by mouth at bedtime., Starting 01/15/2015, Until Discontinued, Normal      CONTINUE these medications which have NOT CHANGED   Details  methocarbamol (ROBAXIN) 500 MG tablet Take 1 tablet (500 mg total) by mouth every 6 (six) hours as needed for muscle spasms., Starting 12/20/2014, Until Discontinued, Print    oxyCODONE-acetaminophen (ROXICET) 5-325 MG per tablet Take 1-2 tablets by mouth every 6 (six) hours as needed for severe pain., Starting 12/20/2014, Until Discontinued, Print       No Known Allergies  The results of significant diagnostics from this hospitalization (including imaging, microbiology, ancillary and laboratory) are listed below for reference.    Significant Diagnostic Studies: Ct Abdomen Pelvis W Contrast  01/12/2015   CLINICAL DATA:  Acute onset of generalized abdominal pain and nausea. Diarrhea. Initial encounter.  EXAM: CT ABDOMEN AND PELVIS WITH CONTRAST  TECHNIQUE: Multidetector CT imaging of the abdomen and pelvis was performed using the standard protocol following bolus administration of intravenous contrast.  CONTRAST:  OMNIPAQUE  IOHEXOL 300 MG/ML  SOLN  COMPARISON:  CT of the abdomen and pelvis from 03/07/2012, and MRI of the lumbar spine from 11/28/2014  FINDINGS: The visualized lung bases are clear. Minimal wall thickening along the distal esophagus could reflect mild chronic inflammation.  The liver and spleen are unremarkable in appearance. The patient is status post cholecystectomy, with clips noted at the gallbladder fossa. The pancreas and adrenal glands are unremarkable.  The kidneys are unremarkable in appearance. There is no evidence of hydronephrosis. No renal or ureteral stones are seen. No perinephric stranding is appreciated.  No free fluid is identified. The small bowel is unremarkable in appearance. The stomach is within normal limits. No acute vascular abnormalities are seen. Scattered calcification is noted along the abdominal aorta and its branches.  The appendix is normal in caliber and contains air without evidence of appendicitis. Scattered diverticulosis is noted along the descending and sigmoid colon, without evidence of diverticulitis.  The bladder is mildly distended and grossly unremarkable. The prostate remains normal in size, with minimal calcification. No inguinal lymphadenopathy is seen.  No acute osseous abnormalities are identified. Prominent subcortical cystic change is noted at the anterior aspect of both femoral heads. Vacuum phenomenon and mild disc space narrowing are seen at L4-L5, with associated anterior and posterior osteophytes.  IMPRESSION: 1. No definite acute abnormality seen to explain the patient's symptoms. 2. Scattered diverticulosis along the descending and sigmoid colon, without evidence of diverticulitis. 3. Minimal wall thickening along the distal esophagus could reflect mild chronic inflammation. 4. Scattered calcification along the abdominal aorta and its branches. 5. Prominent subcortical cystic change at the anterior aspect of both femoral heads.   Electronically Signed   By: Roanna Raider M.D.   On: 01/12/2015 22:54     Labs: Basic Metabolic Panel:  Recent Labs Lab 01/12/15 2033 01/13/15 1055 01/14/15 0637 01/15/15 0614  NA 136 137 135 135  K 4.1 4.0 3.7 3.5  CL 99* 99* 103 101  CO2 GLUCOSE 153* 156* 145* 108*  BUN CREATININE 1.09 1.21 0.94 0.85  CALCIUM 9.7 9.7 8.7* 8.8*   Liver Function Tests:  Recent Labs Lab 01/12/15 2033 01/13/15 1055 01/14/15 0637 01/15/15 0614  AST 46* 43* 50* 50*  ALT 41 40 50 60  ALKPHOS 95 90 71 71  BILITOT 1.2 1.4* 1.4* 1.8*  PROT 9.0* 8.9* 7.2 7.7  ALBUMIN 4.2 4.2 3.4* 3.6    Recent Labs Lab 01/12/15 2033  LIPASE 24   CBC:  Recent Labs Lab 01/12/15 2033 01/13/15 1055 01/14/15 0637 01/15/15 0614  WBC 12.5* 17.2* 16.8* 13.4*  NEUTROABS 8.3* 14.3*  --   --   HGB 16.7 15.9 14.9 15.8  HCT 46.9 45.1 42.6 45.1  MCV 86.4 86.4 86.4 85.7  PLT 209 238 202 186    Principal Problem:   Intractable nausea and vomiting Active Problems:   S/P lumbar discectomy   Leukocytosis   Elevated LFTs   Time coordinating discharge: 35 minutes  Signed:  Brendia Sacks, MD [facility] 01/15/2015, 5:57 PM

## 2015-01-16 ENCOUNTER — Encounter (HOSPITAL_COMMUNITY): Payer: Self-pay | Admitting: *Deleted

## 2015-01-16 ENCOUNTER — Observation Stay (HOSPITAL_COMMUNITY)
Admission: EM | Admit: 2015-01-16 | Discharge: 2015-01-19 | Disposition: A | Discharge status: Home / Self Care | Payer: BLUE CROSS/BLUE SHIELD | Attending: Internal Medicine | Admitting: Internal Medicine

## 2015-01-16 DIAGNOSIS — F1210 Cannabis abuse, uncomplicated: Secondary | ICD-10-CM | POA: Diagnosis not present

## 2015-01-16 DIAGNOSIS — R112 Nausea with vomiting, unspecified: Secondary | ICD-10-CM | POA: Diagnosis not present

## 2015-01-16 DIAGNOSIS — R1110 Vomiting, unspecified: Secondary | ICD-10-CM | POA: Diagnosis not present

## 2015-01-16 DIAGNOSIS — K5900 Constipation, unspecified: Secondary | ICD-10-CM

## 2015-01-16 DIAGNOSIS — M5126 Other intervertebral disc displacement, lumbar region: Secondary | ICD-10-CM | POA: Diagnosis not present

## 2015-01-16 DIAGNOSIS — K5790 Diverticulosis of intestine, part unspecified, without perforation or abscess without bleeding: Secondary | ICD-10-CM | POA: Insufficient documentation

## 2015-01-16 DIAGNOSIS — Z8619 Personal history of other infectious and parasitic diseases: Secondary | ICD-10-CM | POA: Diagnosis not present

## 2015-01-16 DIAGNOSIS — R109 Unspecified abdominal pain: Secondary | ICD-10-CM | POA: Insufficient documentation

## 2015-01-16 DIAGNOSIS — Z87891 Personal history of nicotine dependence: Secondary | ICD-10-CM | POA: Insufficient documentation

## 2015-01-16 DIAGNOSIS — D72829 Elevated white blood cell count, unspecified: Secondary | ICD-10-CM | POA: Diagnosis present

## 2015-01-16 DIAGNOSIS — R110 Nausea: Secondary | ICD-10-CM

## 2015-01-16 DIAGNOSIS — B182 Chronic viral hepatitis C: Secondary | ICD-10-CM | POA: Insufficient documentation

## 2015-01-16 DIAGNOSIS — R531 Weakness: Secondary | ICD-10-CM | POA: Diagnosis present

## 2015-01-16 HISTORY — DX: Cannabis abuse, uncomplicated: F12.10

## 2015-01-16 HISTORY — DX: Chronic viral hepatitis C: B18.2

## 2015-01-16 LAB — URINALYSIS, ROUTINE W REFLEX MICROSCOPIC
Bilirubin Urine: NEGATIVE
Glucose, UA: NEGATIVE mg/dL
KETONES UR: NEGATIVE mg/dL
Leukocytes, UA: NEGATIVE
NITRITE: NEGATIVE
Protein, ur: 100 mg/dL — AB
Specific Gravity, Urine: >1.03 — ABNORMAL HIGH (ref 1.005–1.030)
Urobilinogen, UA: 1 mg/dL (ref 0.0–1.0)
pH: 6 (ref 5.0–8.0)

## 2015-01-16 LAB — CBC
HCT: 43.3 % (ref 39.0–52.0)
HEMOGLOBIN: 15.3 g/dL (ref 13.0–17.0)
MCH: 30.5 pg (ref 26.0–34.0)
MCHC: 35.3 g/dL (ref 30.0–36.0)
MCV: 86.3 fL (ref 78.0–100.0)
Platelets: 162 10*3/uL (ref 150–400)
RBC: 5.02 MIL/uL (ref 4.22–5.81)
RDW: 13.3 % (ref 11.5–15.5)
WBC: 14.7 10*3/uL — ABNORMAL HIGH (ref 4.0–10.5)

## 2015-01-16 LAB — BASIC METABOLIC PANEL
Anion gap: 9 (ref 5–15)
BUN: 13 mg/dL (ref 6–20)
CHLORIDE: 103 mmol/L (ref 101–111)
CO2: 27 mmol/L (ref 22–32)
Calcium: 8.5 mg/dL — ABNORMAL LOW (ref 8.9–10.3)
Creatinine, Ser: 1.22 mg/dL (ref 0.61–1.24)
GFR calc Af Amer: >60 mL/min (ref >60)
GFR calc non Af Amer: >60 mL/min (ref >60)
Glucose, Bld: 151 mg/dL — ABNORMAL HIGH (ref 65–99)
Potassium: 3.8 mmol/L (ref 3.5–5.1)
Sodium: 139 mmol/L (ref 135–145)

## 2015-01-16 LAB — HEPATITIS PANEL, ACUTE
Hep A IgM: NEGATIVE
Hep B C IgM: NEGATIVE
Hepatitis B Surface Ag: NEGATIVE

## 2015-01-16 LAB — URINE MICROSCOPIC-ADD ON

## 2015-01-16 LAB — RAPID URINE DRUG SCREEN, HOSP PERFORMED
AMPHETAMINES: NOT DETECTED
Barbiturates: NOT DETECTED
Benzodiazepines: NOT DETECTED
Cocaine: NOT DETECTED
Opiates: NOT DETECTED
Tetrahydrocannabinol: POSITIVE — AB

## 2015-01-16 MED ORDER — ENOXAPARIN SODIUM 40 MG/0.4ML ~~LOC~~ SOLN
40.0000 mg | SUBCUTANEOUS | Status: DC
  Administered 2015-01-16: 40 mg via SUBCUTANEOUS
  Filled 2015-01-16: qty 0.4

## 2015-01-16 MED ORDER — SODIUM CHLORIDE 0.9 % IV SOLN: INTRAVENOUS | Status: DC

## 2015-01-16 MED ORDER — SODIUM CHLORIDE 0.9 % IV BOLUS (SEPSIS)
2000.0000 mL | Freq: Once | INTRAVENOUS | Status: AC
  Administered 2015-01-16: 2000 mL via INTRAVENOUS

## 2015-01-16 MED ORDER — SODIUM CHLORIDE 0.9 % IV SOLN
INTRAVENOUS | Status: DC
  Administered 2015-01-16 – 2015-01-18 (×3): via INTRAVENOUS

## 2015-01-16 MED ORDER — OXYCODONE-ACETAMINOPHEN 5-325 MG PO TABS
1.0000 | ORAL_TABLET | Freq: Four times a day (QID) | ORAL | Status: DC | PRN
Start: 2015-01-16 — End: 2015-01-19
  Administered 2015-01-18: 1 via ORAL
  Administered 2015-01-19: 2 via ORAL
  Filled 2015-01-16: qty 1
  Filled 2015-01-16 (×3): qty 2

## 2015-01-16 MED ORDER — PROMETHAZINE HCL 25 MG/ML IJ SOLN
12.5000 mg | Freq: Once | INTRAMUSCULAR | Status: AC
  Administered 2015-01-16: 12.5 mg via INTRAVENOUS
  Filled 2015-01-16: qty 1

## 2015-01-16 MED ORDER — ONDANSETRON HCL 4 MG/2ML IJ SOLN
4.0000 mg | Freq: Once | INTRAMUSCULAR | Status: AC
  Administered 2015-01-16: 4 mg via INTRAVENOUS
  Filled 2015-01-16: qty 2

## 2015-01-16 MED ORDER — PROMETHAZINE HCL 12.5 MG PO TABS
12.5000 mg | ORAL_TABLET | Freq: Four times a day (QID) | ORAL | Status: DC | PRN
  Administered 2015-01-17: 12.5 mg via ORAL
  Filled 2015-01-16 (×2): qty 1

## 2015-01-16 MED ORDER — SODIUM CHLORIDE 0.9 % IV BOLUS (SEPSIS)
500.0000 mL | Freq: Once | INTRAVENOUS | Status: AC
  Administered 2015-01-16: 500 mL via INTRAVENOUS

## 2015-01-16 MED ORDER — BISACODYL 10 MG RE SUPP
10.0000 mg | Freq: Once | RECTAL | Status: AC
  Administered 2015-01-16: 10 mg via RECTAL
  Filled 2015-01-16: qty 1

## 2015-01-16 NOTE — ED Notes (Signed)
Unable to tolerate PO fluid

## 2015-01-16 NOTE — ED Provider Notes (Signed)
CSN: [id number]     Arrival date & time 01/16/15  1837 History   First MD Initiated Contact with Patient 01/16/15 1920     Chief Complaint  Patient presents with  . Weakness     (Consider location/radiation/quality/duration/timing/severity/associated sxs/prior Treatment) HPI   Joshua Lester is a 59 y.o. male who is here for evaluation of chills, diaphoresis, decreased stooling, and vomiting. He began to have chills last night. He was discharged from the hospital yesterday, after admission and treatment for "viral gastroenteritis." He had lower back surgery about one month ago. He is concerned that he might have an infection causing the problem because he was told that he had diverticulosis. He denies documented fever, cough, shortness of breath, back pain or weakness and dizziness. He is taking his usual medications, without relief. There are no known sick contacts. There are no other known modifying factors.   Past Medical History  Diagnosis Date  . Lumbar herniated disc   . Diverticulosis   . Marijuana abuse 01/16/2015  . Hepatitis C, chronic JUL 2016   Past Surgical History  Procedure Laterality Date  . Facial reconstruction surgery    . Cholecystectomy  2011  . Lumbar laminectomy/decompression microdiscectomy N/A 12/20/2014    Procedure: L5-S1 Microdiscectomy, Bilateral;  Surgeon: Eldred Manges, MD;  Location: [facility] OR;  Service: Orthopedics;  Laterality: N/A;  . Tracheostomy    . Colonoscopy      Dr. Loreta Ave    Family History  Problem Relation Age of Onset  . Colon cancer Neg Hx    History  Substance Use Topics  . Smoking status: Former Smoker -- 0.50 packs/day for 40 years    Types: Cigarettes    Quit date: 01/13/2012  . Smokeless tobacco: Not on file  . Alcohol Use: No     Comment: no ETOH for 25 years    Review of Systems  All other systems reviewed and are negative.     Allergies  Review of patient's allergies indicates no known allergies.  Home  Medications   Prior to Admission medications   Medication Sig Start Date End Date Taking? Authorizing Provider  famotidine (PEPCID) 20 MG tablet Take 1 tablet (20 mg total) by mouth at bedtime. 01/15/15  Yes Standley Brooking, MD  methocarbamol (ROBAXIN) 500 MG tablet Take 1 tablet (500 mg total) by mouth every 6 (six) hours as needed for muscle spasms. 12/20/14  Yes Naida Sleight, PA-C  oxyCODONE-acetaminophen (ROXICET) 5-325 MG per tablet Take 1-2 tablets by mouth every 6 (six) hours as needed for severe pain. 12/20/14  Yes Naida Sleight, PA-C  promethazine (PHENERGAN) 25 MG tablet Take 25 mg by mouth every 6 (six) hours as needed. 01/15/15  Yes Historical Provider, MD   BP 137/87 mmHg  Pulse 73  Temp(Src) 99 F (37.2 C) (Oral)  Resp 18  Ht  (1.702 m)  Wt 186 lb 4.8 oz (84.505 kg)  BMI 29.17 kg/m2  SpO2 97% Physical Exam  Constitutional: He is oriented to person, place, and time. He appears well-developed and well-nourished. No distress.  HENT:  Head: Normocephalic and atraumatic.  Right Ear: External ear normal.  Left Ear: External ear normal.  Eyes: Conjunctivae and EOM are normal. Pupils are equal, round, and reactive to light.  Neck: Normal range of motion and phonation normal. Neck supple.  Cardiovascular: Normal rate, regular rhythm and normal heart sounds.   Pulmonary/Chest: Effort normal and breath sounds normal. He exhibits no bony tenderness.  Abdominal: Soft. There is no tenderness.  On my arrival into the room. He is spitting and vomiting clear mucus. He is on his hands and knees, on the floor and spitting into a trash can. He requested Phenergan for nausea.  Musculoskeletal: Normal range of motion.  Neurological: He is alert and oriented to person, place, and time. No cranial nerve deficit or sensory deficit. He exhibits normal muscle tone. Coordination normal.  Skin: Skin is warm, dry and intact.  Psychiatric: He has a normal mood and affect. His behavior is normal.  Judgment and thought content normal.  Nursing note and vitals reviewed.   ED Course  Procedures (including critical care time)  Medications  0.9 %  sodium chloride infusion ( Intravenous New Bag/Given 01/17/15 1702)  oxyCODONE-acetaminophen (PERCOCET/ROXICET) 5-325 MG per tablet 1-2 tablet (not administered)  promethazine (PHENERGAN) tablet 12.5 mg (12.5 mg Oral Given 01/17/15 0532)  pantoprazole (PROTONIX) EC tablet 40 mg (40 mg Oral Given 01/17/15 1700)  ondansetron (ZOFRAN-ODT) disintegrating tablet 4 mg (4 mg Oral Given 01/17/15 2133)  polyethylene glycol (MIRALAX / GLYCOLAX) packet 17 g (17 g Oral Not Given 01/17/15 2200)  senna (SENOKOT) tablet 8.6 mg (8.6 mg Oral Given 01/17/15 2133)  promethazine (PHENERGAN) injection 12.5 mg (12.5 mg Intravenous Given 01/17/15 2134)  ondansetron (ZOFRAN) injection 4 mg (4 mg Intravenous Given 01/16/15 1955)  sodium chloride 0.9 % bolus 500 mL (0 mLs Intravenous Stopped 01/16/15 2156)  promethazine (PHENERGAN) injection 12.5 mg (12.5 mg Intravenous Given 01/16/15 2017)  sodium chloride 0.9 % bolus 2,000 mL (0 mLs Intravenous Stopped 01/16/15 2156)  bisacodyl (DULCOLAX) suppository 10 mg (10 mg Rectal Given 01/16/15 2018)  promethazine (PHENERGAN) injection 12.5 mg (12.5 mg Intravenous Given 01/16/15 2335)    Patient Vitals for the past 24 hrs:  BP Temp Temp src Pulse Resp SpO2  01/17/15 2132 137/87 mmHg 99 F (37.2 C) Oral 73 18 97 %  01/17/15 1536 133/75 mmHg 99.4 F (37.4 C) Oral 73 20 97 %  01/17/15 0454 (!) 159/94 mmHg 98.7 F (37.1 C) Oral 85 20 96 %    9:36 PM Reevaluation with update and discussion. After initial assessment and treatment, an updated evaluation reveals patient was resting comfortably, then I aroused him and he sat up and began spitting into the emesis bag. He states that he passed some gas after receiving the suppository. He feels like his abdomen is "full". He states that he feels very "uncomfortable". He does not feel that his  symptoms have been controlled, yet. [name]   9:43 PM-Consult complete with Dr. Conley Rolls. Patient case explained and discussed. He agrees to admit patient for further evaluation and treatment. Call ended at 2150  Labs Review Labs Reviewed  BASIC METABOLIC PANEL - Abnormal; Notable for the following:    Glucose, Bld 151 (*)    Calcium 8.5 (*)    All other components within normal limits  CBC - Abnormal; Notable for the following:    WBC 14.7 (*)    All other components within normal limits  URINALYSIS, ROUTINE W REFLEX MICROSCOPIC (NOT AT [facility]) - Abnormal; Notable for the following:    Specific Gravity, Urine >1.030 (*)    Hgb urine dipstick MODERATE (*)    Protein, ur 100 (*)    All other components within normal limits  URINE RAPID DRUG SCREEN, HOSP PERFORMED - Abnormal; Notable for the following:    Tetrahydrocannabinol POSITIVE (*)    All other components within normal limits  URINE MICROSCOPIC-ADD ON -  Abnormal; Notable for the following:    Bacteria, UA FEW (*)    All other components within normal limits  COMPREHENSIVE METABOLIC PANEL - Abnormal; Notable for the following:    Glucose, Bld 135 (*)    Calcium 8.4 (*)    All other components within normal limits  CBC - Abnormal; Notable for the following:    WBC 14.3 (*)    All other components within normal limits  HCV RNA QUANT RFLX ULTRA OR GENOTYP    Imaging Review No results found.   EKG Interpretation None      MDM   Final diagnoses:  Intractable vomiting with nausea, vomiting of unspecified type  Marijuana abuse  Abdominal discomfort    Persistent nausea and vomiting with abdominal comfort, persistent.  Recent CT scan, which was nondiagnostic. Patient's symptoms have returned, and he remains uncomfortable. Urine drug screen is positive for THC. Therefore, hyperemesis, cannabinoid syndrome should be considered.   Nursing Notes Reviewed/ Care Coordinated, and agree without changes. Applicable Imaging  Reviewed.  Interpretation of Laboratory Data incorporated into ED treatment   Plan- hospitalist to evaluate for admission, likely on observation basis    Mancel Bale, MD 01/17/15 2358

## 2015-01-16 NOTE — H&P (Signed)
[facility] History and Physical  Joshua Lester ZOX:[id number] DOB: 1955-08-21 DOA: 01/16/2015  Referring physician: ER, Dr. Effie Shy PCP: No PCP Per Patient   Chief Complaint: Nausea and vomiting.  HPI: Joshua Lester is a 59 y.o. male  This is a 59 year old man who was discharged yesterday from this hospital after having been admitted with same symptoms of intractable nausea and vomiting. It was felt that he likely had a viral gastroenteritis. Minimal wall thickening of the distal esophagus was noted on CT scan, unclear significance. He comes in again because he has continued to have nausea and vomiting today. He denies any abdominal pain, diarrhea or fever. He is now being admitted for further management.   Review of Systems:  Apart from symptoms above, all systems negative.  Past Medical History  Diagnosis Date  . Lumbar herniated disc   . Medical history non-contributory   . Diverticulosis   . Marijuana abuse 01/16/2015   Past Surgical History  Procedure Laterality Date  . Facial reconstruction surgery    . Cholecystectomy  2011  . Lumbar laminectomy/decompression microdiscectomy N/A 12/20/2014    Procedure: L5-S1 Microdiscectomy, Bilateral;  Surgeon: Eldred Manges, MD;  Location: [facility] OR;  Service: Orthopedics;  Laterality: N/A;   Social History:  reports that he quit smoking about 3 years ago. His smoking use included Cigarettes. He has a 20 pack-year smoking history. He does not have any smokeless tobacco history on file. He reports that he uses illicit drugs (Marijuana). He reports that he does not drink alcohol.  No Known Allergies   Family history: Mother has COPD.   Prior to Admission medications   Medication Sig Start Date End Date Taking? Authorizing Provider  famotidine (PEPCID) 20 MG tablet Take 1 tablet (20 mg total) by mouth at bedtime. 01/15/15  Yes Standley Brooking, MD  methocarbamol (ROBAXIN) 500 MG tablet Take 1 tablet (500 mg total) by mouth  every 6 (six) hours as needed for muscle spasms. 12/20/14  Yes Naida Sleight, PA-C  oxyCODONE-acetaminophen (ROXICET) 5-325 MG per tablet Take 1-2 tablets by mouth every 6 (six) hours as needed for severe pain. 12/20/14  Yes Naida Sleight, PA-C  promethazine (PHENERGAN) 25 MG tablet Take 25 mg by mouth every 6 (six) hours as needed. 01/15/15  Yes Historical Provider, MD   Physical Exam: Filed Vitals:   01/16/15 1839 01/16/15 2157  BP: 153/90 152/84  Pulse: 93 72  Temp: 98.7 F (37.1 C) 98.2 F (36.8 C)  TempSrc: Oral Oral  Resp: 20 24  Height: 5\' 7"  (1.702 m)   Weight: 83.915 kg (185 lb)   SpO2: 99% 96%    Wt Readings from Last 3 Encounters:  01/16/15 83.915 kg (185 lb)  01/13/15 88.451 kg (195 lb)  12/20/14 89.812 kg (198 lb)    General:  Appears calm and comfortable. He does not look clinically dehydrated. He seems somewhat uncomfortable. He is not toxic or septic clinically. Eyes: PERRL, normal lids, irises & conjunctiva ENT: grossly normal hearing, lips & tongue Neck: no LAD, masses or thyromegaly Cardiovascular: RRR, no m/r/g. No LE edema. Telemetry: SR, no arrhythmias  Respiratory: CTA bilaterally, no w/r/r. Normal respiratory effort. Abdomen: soft, ntnd Skin: no rash or induration seen on limited exam Musculoskeletal: grossly normal tone BUE/BLE Psychiatric: grossly normal mood and affect, speech fluent and appropriate Neurologic: grossly non-focal.          Labs on Admission:  Basic Metabolic Panel:  Recent Labs Lab 01/12/15 2033 01/13/15  1055 01/14/15 0637 01/15/15 0614 01/16/15 1956  NA 136 137 135 135 139  K 4.1 4.0 3.7 3.5 3.8  CL 99* 99* 103 101 103  CO2 GLUCOSE 153* 156* 145* 108* 151*  BUN CREATININE 1.09 1.21 0.94 0.85 1.22  CALCIUM 9.7 9.7 8.7* 8.8* 8.5*   Liver Function Tests:  Recent Labs Lab 01/12/15 2033 01/13/15 1055 01/14/15 0637 01/15/15 0614  AST 46* 43* 50* 50*  ALT 41 40 50 60  ALKPHOS 95 90  71 71  BILITOT 1.2 1.4* 1.4* 1.8*  PROT 9.0* 8.9* 7.2 7.7  ALBUMIN 4.2 4.2 3.4* 3.6    Recent Labs Lab 01/12/15 2033  LIPASE 24   No results for input(s): AMMONIA in the last 168 hours. CBC:  Recent Labs Lab 01/12/15 2033 01/13/15 1055 01/14/15 0637 01/15/15 0614 01/16/15 1956  WBC 12.5* 17.2* 16.8* 13.4* 14.7*  NEUTROABS 8.3* 14.3*  --   --   --   HGB 16.7 15.9 14.9 15.8 15.3  HCT 46.9 45.1 42.6 45.1 43.3  MCV 86.4 86.4 86.4 85.7 86.3  PLT 209 238 202 186 162   Cardiac Enzymes: No results for input(s): CKTOTAL, CKMB, CKMBINDEX, TROPONINI in the last 168 hours.  BNP (last 3 results) No results for input(s): BNP in the last 8760 hours.  ProBNP (last 3 results) No results for input(s): PROBNP in the last 8760 hours.  CBG: No results for input(s): GLUCAP in the last 168 hours.  Radiological Exams on Admission: No results found.    Assessment/Plan   1. Intractable nausea and vomiting. This could represent a viral illness. Also his urine drug screen is positive for marijuana. He denies the use of this recently. He'll be treated with IV fluids and antiemetics as needed. 2. Marijuana abuse. This could cause some of these symptoms.  Further recommendations will depend on patient's hospital progress.  Code Status: Full code.   DVT Prophylaxis: Lovenox.   Family Communication: I discussed the plan with the patient at the bedside.  Disposition Plan: Home when medically stable  Time spent: 45 minutes.  Wilson Singer [facility] Pager [phone].

## 2015-01-16 NOTE — ED Notes (Addendum)
Pt c/o vomiting, chills, weakness and SOB since last Wednesday night. No urinary problems. Pt reports he hasn't been able to have a BM, last BM yesterday 01/15/15. Pt able to keep food down today. Pt's wife reports pt has been diaphoretic and skin has been hot and cold all day.

## 2015-01-17 ENCOUNTER — Encounter (HOSPITAL_COMMUNITY): Payer: Self-pay | Admitting: Gastroenterology

## 2015-01-17 ENCOUNTER — Inpatient Hospital Stay (HOSPITAL_COMMUNITY): Payer: BLUE CROSS/BLUE SHIELD

## 2015-01-17 DIAGNOSIS — B182 Chronic viral hepatitis C: Secondary | ICD-10-CM | POA: Insufficient documentation

## 2015-01-17 DIAGNOSIS — K5900 Constipation, unspecified: Secondary | ICD-10-CM | POA: Diagnosis not present

## 2015-01-17 DIAGNOSIS — F1210 Cannabis abuse, uncomplicated: Secondary | ICD-10-CM | POA: Diagnosis not present

## 2015-01-17 DIAGNOSIS — D72829 Elevated white blood cell count, unspecified: Secondary | ICD-10-CM | POA: Diagnosis not present

## 2015-01-17 DIAGNOSIS — R112 Nausea with vomiting, unspecified: Secondary | ICD-10-CM | POA: Diagnosis present

## 2015-01-17 DIAGNOSIS — R1110 Vomiting, unspecified: Secondary | ICD-10-CM | POA: Diagnosis not present

## 2015-01-17 LAB — COMPREHENSIVE METABOLIC PANEL
ALK PHOS: 65 U/L (ref 38–126)
ALT: 62 U/L (ref 17–63)
AST: 41 U/L (ref 15–41)
Albumin: 3.5 g/dL (ref 3.5–5.0)
Anion gap: 7 (ref 5–15)
BUN: 10 mg/dL (ref 6–20)
CO2: 27 mmol/L (ref 22–32)
Calcium: 8.4 mg/dL — ABNORMAL LOW (ref 8.9–10.3)
Chloride: 102 mmol/L (ref 101–111)
Creatinine, Ser: 0.85 mg/dL (ref 0.61–1.24)
GFR calc Af Amer: >60 mL/min (ref >60)
GFR calc non Af Amer: >60 mL/min (ref >60)
GLUCOSE: 135 mg/dL — AB (ref 65–99)
POTASSIUM: 3.7 mmol/L (ref 3.5–5.1)
SODIUM: 136 mmol/L (ref 135–145)
TOTAL PROTEIN: 7.4 g/dL (ref 6.5–8.1)
Total Bilirubin: 1 mg/dL (ref 0.3–1.2)

## 2015-01-17 LAB — CBC
HEMATOCRIT: 43 % (ref 39.0–52.0)
HEMOGLOBIN: 15 g/dL (ref 13.0–17.0)
MCH: 29.8 pg (ref 26.0–34.0)
MCHC: 34.9 g/dL (ref 30.0–36.0)
MCV: 85.5 fL (ref 78.0–100.0)
Platelets: 184 10*3/uL (ref 150–400)
RBC: 5.03 MIL/uL (ref 4.22–5.81)
RDW: 13.3 % (ref 11.5–15.5)
WBC: 14.3 10*3/uL — ABNORMAL HIGH (ref 4.0–10.5)

## 2015-01-17 MED ORDER — ONDANSETRON 4 MG PO TBDP
4.0000 mg | ORAL_TABLET | Freq: Three times a day (TID) | ORAL | Status: DC
  Administered 2015-01-17 – 2015-01-19 (×8): 4 mg via ORAL
  Filled 2015-01-17 (×8): qty 1

## 2015-01-17 MED ORDER — POLYETHYLENE GLYCOL 3350 17 G PO PACK
17.0000 g | PACK | Freq: Two times a day (BID) | ORAL | Status: DC
  Administered 2015-01-19: 17 g via ORAL
  Filled 2015-01-17 (×3): qty 1

## 2015-01-17 MED ORDER — SENNA 8.6 MG PO TABS
1.0000 | ORAL_TABLET | Freq: Every day | ORAL | Status: DC
  Administered 2015-01-17 – 2015-01-18 (×2): 8.6 mg via ORAL
  Filled 2015-01-17 (×2): qty 1

## 2015-01-17 MED ORDER — PROMETHAZINE HCL 25 MG/ML IJ SOLN
12.5000 mg | Freq: Four times a day (QID) | INTRAMUSCULAR | Status: DC | PRN
  Administered 2015-01-17 – 2015-01-18 (×2): 12.5 mg via INTRAVENOUS
  Filled 2015-01-17 (×2): qty 1

## 2015-01-17 MED ORDER — PANTOPRAZOLE SODIUM 40 MG PO TBEC
40.0000 mg | DELAYED_RELEASE_TABLET | Freq: Two times a day (BID) | ORAL | Status: DC
  Administered 2015-01-17 – 2015-01-19 (×4): 40 mg via ORAL
  Filled 2015-01-17 (×4): qty 1

## 2015-01-17 NOTE — Care Management Note (Signed)
Case Management Note  Patient Details  Name: Joshua Lester MRN: [id number] Date of Birth: Mar 08, 1956  Expected Discharge Date:  01/18/15               Expected Discharge Plan:  Home/Self Care  In-House Referral:  NA  Discharge planning Services  CM Consult  Post Acute Care Choice:  NA Choice offered to:  NA  DME Arranged:    DME Agency:     HH Arranged:    HH Agency:     Status of Service:  Completed, signed off  Medicare Important Message Given:    Date Medicare IM Given:    Medicare IM give by:    Date Additional Medicare IM Given:    Additional Medicare Important Message give by:     If discussed at Long Length of Stay Meetings, dates discussed:    Additional Comments: Patient is from home and independent with ADL's. Pt has no HH services. Pt plans to discharge home with self care. Pt needs to call [facility] to make f/u appointment. CM has called and the office is accepting new patients, pt requests he call and schedule appointment himself. Malcolm Metro, RN 01/17/2015, 2:17 PM

## 2015-01-17 NOTE — Consult Note (Signed)
Referring Provider: Dr. Irene Limbo Primary Care Physician:  No PCP Per Patient Primary Gastroenterologist:  Dr. Loreta Ave in [place] performed last colonoscopy. Primary GI this admission Dr. Darrick Penna   Date of Admission: 01/16/15 Date of Consultation: 01/17/15  Reason for Consultation:  Nausea and Vomiting   HPI:  Joshua Lester is a 59 y.o. year old male who was recently inpatient July 21-23 with intractable nausea and vomiting with suspected gastroenteritis, now admitted again with persistent nausea, vomiting, with GI consulted due to continued symptoms. CT July 20th noted minimal wall thickening along the distal esophagus.   Per patient, states only health problem last 8 years was back problems. Had back operation end of June. Felt like a "new man" after operation. Second week after operation, felt depressed, not eating well. Ate soup and pancakes over the weekend, stomach felt hard again. Feels queasy. States once percocet was stopped since back surgery, he started feeling worse. Tried to drink juice but unable to tolerate this morning. Intractable N/V. Able to tolerate ice chips. Stomach feels like a volcano, bubbling, festering. Diffusely. No melena. No dysphagia. Notes new onset reflux past few weeks. Last BM a few days ago. Diarrhea at that time. Resolved now. Abdomen feels bloated/tight. Nausea always underlying. +chills, no fever. A1c 6.5. When sitting up starts burping and feeling sick.   Out of work 6-8 weeks. Marijuana use possibly 2-3 weeks ago prior to back surgery. Urine drug screen positive for marijuana only. Hep C antibody positive. Notes history of IV drug use as teenager, tattoos from "friends" in remote past.   Colonoscopy by Dr. Loreta Ave a few years ago. No prior EGD.    Past Medical History  Diagnosis Date  . Lumbar herniated disc   . Diverticulosis   . Marijuana abuse 01/16/2015    Past Surgical History  Procedure Laterality Date  . Facial reconstruction surgery     . Cholecystectomy  2011  . Lumbar laminectomy/decompression microdiscectomy N/A 12/20/2014    Procedure: L5-S1 Microdiscectomy, Bilateral;  Surgeon: Eldred Manges, MD;  Location[facility] OR;  Service: Orthopedics;  Laterality: N/A;  . Tracheostomy    . Colonoscopy      Dr. Loreta Ave     Prior to Admission medications   Medication Sig Start Date End Date Taking? Authorizing Provider  famotidine (PEPCID) 20 MG tablet Take 1 tablet (20 mg total) by mouth at bedtime. 01/15/15  Yes Standley Brooking, MD  methocarbamol (ROBAXIN) 500 MG tablet Take 1 tablet (500 mg total) by mouth every 6 (six) hours as needed for muscle spasms. 12/20/14  Yes Naida Sleight, PA-C  oxyCODONE-acetaminophen (ROXICET) 5-325 MG per tablet Take 1-2 tablets by mouth every 6 (six) hours as needed for severe pain. 12/20/14  Yes Naida Sleight, PA-C  promethazine (PHENERGAN) 25 MG tablet Take 25 mg by mouth every 6 (six) hours as needed. 01/15/15  Yes Historical Provider, MD    Current Facility-Administered Medications  Medication Dose Route Frequency Provider Last Rate Last Dose  . 0.9 %  sodium chloride infusion   Intravenous Continuous Mancel Bale, MD 125 mL/hr at 01/16/15 2334    . oxyCODONE-acetaminophen (PERCOCET/ROXICET) 5-325 MG per tablet 1-2 tablet  1-2 tablet Oral Q6H PRN Nimish C Gosrani, MD      . promethazine (PHENERGAN) tablet 12.5 mg  12.5 mg Oral Q6H PRN Nimish C Gosrani, MD   12.5 mg at 01/17/15 0532    Allergies as of 01/16/2015  . (No Known Allergies)  Family History  Problem Relation Age of Onset  . Colon cancer Neg Hx     History   Social History  . Marital Status: Married    Spouse Name: N/A  . Number of Children: N/A  . Years of Education: N/A   Occupational History  . Not on file.   Social History Main Topics  . Smoking status: Former Smoker -- 0.50 packs/day for 40 years    Types: Cigarettes    Quit date: 01/13/2012  . Smokeless tobacco: Not on file  . Alcohol Use: No     Comment: no  ETOH for 25 years  . Drug Use: Yes    Special: Marijuana     Comment: none x couple weeks 01/16/15. History of IV drug use as a teenager  . Sexual Activity: Yes   Other Topics Concern  . Not on file   Social History Narrative    Review of Systems: Gen: see HPI CV: Denies chest pain, heart palpitations, syncope, edema  Resp: Denies shortness of breath with rest, cough, wheezing GI: see HPI GU : Denies urinary burning, urinary frequency, urinary incontinence.  MS: Denies joint pain,swelling, cramping Derm: Denies rash, itching, dry skin Psych: Denies depression, anxiety,confusion, or memory loss Heme: Denies bruising, bleeding, and enlarged lymph nodes.  Physical Exam: Vital signs in last 24 hours: Temp:  [98.2 F (36.8 C)-99 F (37.2 C)] 98.7 F (37.1 C) (07/25 0454) Pulse Rate:  [72-93] 85 (07/25 0454) Resp:  [20-24] 20 (07/25 0454) BP: (151-159)/(84-94) 159/94 mmHg (07/25 0454) SpO2:  [95 %-99 %] 96 % (07/25 0454) Weight:  [185 lb (83.915 kg)-186 lb 4.8 oz (84.505 kg)] 186 lb 4.8 oz (84.505 kg) (07/24 2251) Last BM Date: 01/15/15 General:   Alert,  Well-developed, well-nourished, pleasant and cooperative in NAD Head:  Normocephalic and atraumatic. Eyes:  Sclera clear, no icterus.   Conjunctiva pink. Ears:  Normal auditory acuity. Nose:  No deformity, discharge,  or lesions. Mouth:  No deformity or lesions, dentition normal. Lungs:  Clear throughout to auscultation.   No wheezes, crackles, or rhonchi. No acute distress. Heart:  Regular rate and rhythm; no murmurs. Abdomen:  Soft, nontender, obese. Round/protuberant but non-tense. Liver margin smooth, palpable 1-2 fingerbreadths below right subcostal margin.   Rectal:  Deferred  Msk:  Symmetrical without gross deformities. Normal posture Extremities:  Without  edema. Neurologic:  Alert and  oriented x4;  grossly normal neurologically. Skin:  Intact without significant lesions or rashes. Psych:  Alert and cooperative.  Normal mood and affect.  Intake/Output from previous day: 07/24 0701 - 07/25 0700 In: 804.2 [I.V.:804.2] Out: 500 [Urine:500] Intake/Output this shift:    Lab Results:  Recent Labs  01/15/15 0614 01/16/15 1956 01/17/15 0612  WBC 13.4* 14.7* 14.3*  HGB 15.8 15.3 15.0  HCT 45.1 43.3 43.0  PLT 186 162 184   BMET  Recent Labs  01/15/15 0614 01/16/15 1956 01/17/15 0612  NA 135 139 136  K 3.5 3.8 3.7  CL 101 103 102  CO2 GLUCOSE 108* 151* 135*  BUN CREATININE 0.85 1.22 0.85  CALCIUM 8.8* 8.5* 8.4*   Lab Results  Component Value Date   LIPASE 24 01/12/2015    LFT  Recent Labs  01/15/15 0614 01/17/15 0612  PROT 7.7 7.4  ALBUMIN 3.6 3.5  AST 50* 41  ALT 60 62  ALKPHOS 71 65  BILITOT 1.8* 1.0  Hepatitis Panel  Recent Labs  01/15/15 1610  HEPBSAG Negative  HCVAB >11.0*  HEPAIGM Negative  HEPBIGM Negative    Impression: 59 year old male admitted with intractable nausea and vomiting, new onset reflux, and concern for esophageal wall thickening on recent CT. Likely related to esophagitis, less likely malignancy. With positive drug screen for marijuana, will be unable to proceed with elective EGD with Propofol this admission. Recommend starting PPI BID and marijuana cessation, with repeat drug screen in next few weeks. Ultimately needs EGD with Dr. Darrick Penna (Propofol) as outpatient to directly visualize esophagus.   Intractable N/V: multifactorial secondary to GERD symptoms. With A1c mildly elevated at 6.5, unable to exclude possibility of delayed gastric emptying. Use of marijuana could also play a role.   Positive Hep C antibody: several risk factors positive, needs Hep C RNA with PCR and reflex genotype. LFTs overall unremarkable. Elastography to be pursued as outpatient for assessment of fibrosis/cirrhosis and in preparation for possible treatment as outpatient.   Plan: Hep C RNA with reflex genotype [place] with elastography as  outpatient Marijuana cessation: discussed in detail with patient who stated understanding. Outpatient EGD in 4 weeks with Propofol (needs negative drug screen)  Protonix 40 mg po BID, first dose now Zofran 4 mg po TID with meals and bedtime, first dose now Will follow-up on GI pathogen panel as it comes available Will continue to follow with you  Nira Retort, ANP-BC [facility]     LOS: 1 day    01/17/2015, 9:23 AM

## 2015-01-17 NOTE — Progress Notes (Signed)
  PROGRESS NOTE  Joshua Lester ZOX:[id number] DOB: 1955/08/21 DOA: 01/16/2015 PCP: No PCP Per Patient  Summary: 59 year old man discharged 7/23 after being treated for nausea and vomiting which rapidly resolved with supportive care. He returned to the emergency department with nausea, vomiting and was admitted for further evaluation of the same.  Assessment/Plan: 1. Injectable nausea and vomiting. Some nausea, vomitng resolved.Etiology unclear. Mild leukocytosis without significant change. CT abdomen and pelvis within the last few days was essentially unremarkable except as below. 2. Leukocytosis. Etiology and significance unclear. There is no fever or other signs of infection. 3. Minimal wall thickening of the distal esophagus seen on CT, consider mild chronic inflammation. Patient denies history of chronic GERD. 4. Elevated AST and total bilirubin seen on last admission have resolved. 5. Positive hepatitis C antibody. 6. Constipation 7. THC use.   Appreciate GI. Plan for EGD in 4 weeks, [place] with elastography as outpatient  Protonix BID  Stop THC  Zofran  Bowel regimen  Code Status: full code DVT prophylaxis: SCDs Family Communication: none present, pt alert, understands plan Disposition Plan: home when improved  Brendia Sacks, MD  [facility]  Pager [phone] If 7PM-7AM, please contact night-coverage at [email or web address], password [facility] 01/17/2015, 8:40 AM  LOS: 1 day   Consultants:  GI  Procedures:    Antibiotics:    HPI/Subjective: Passing flatus but no BM since discharge. Queasy stomach. No vomiting.   Objective: Filed Vitals:   01/16/15 1839 01/16/15 2157 01/16/15 2251 01/17/15 0454  BP: 153/90 152/84 151/85 159/94  Pulse: 93 72 79 85  Temp: 98.7 F (37.1 C) 98.2 F (36.8 C) 99 F (37.2 C) 98.7 F (37.1 C)  TempSrc: Oral Oral Oral Oral  Resp: Height:  (1.702 m)   (1.702 m)   Weight: 83.915 kg (185 lb)  84.505 kg (186  lb 4.8 oz)   SpO2: 99% 96% 95% 96%    Intake/Output Summary (Last 24 hours) at 01/17/15 0840 Last data filed at 01/17/15 0600  Gross per 24 hour  Intake 804.17 ml  Output    500 ml  Net 304.17 ml     Filed Weights   01/16/15 1839 01/16/15 2251  Weight: 83.915 kg (185 lb) 84.505 kg (186 lb 4.8 oz)    Exam:     Afebrile, vital signs stable, no hypoxia General:  Appears calm and comfortable Cardiovascular: RRR, no m/r/g.  Respiratory: CTA bilaterally, no w/r/r. Normal respiratory effort. Abdomen: soft, ntnd, positive bowel sounds Psychiatric: grossly normal mood and affect, speech fluent and appropriate  New data reviewed:  BMP unremarkalbe  WBC 14.3, no change  Hepatitis C antibody postive  Pertinent data since admission:  UDS positive for THC  Pending data:  GI pathogen panel pending from last admission  Scheduled Meds: . enoxaparin (LOVENOX) injection  40 mg Subcutaneous Q24H   Continuous Infusions: . sodium chloride 125 mL/hr at 01/16/15 2334    Principal Problem:   Intractable nausea and vomiting Active Problems:   Leukocytosis   Marijuana abuse   Hep C w/o coma, chronic   Constipation   Time spent 20 minutes

## 2015-01-18 ENCOUNTER — Encounter (HOSPITAL_COMMUNITY): Payer: Self-pay

## 2015-01-18 ENCOUNTER — Observation Stay (HOSPITAL_COMMUNITY): Payer: BLUE CROSS/BLUE SHIELD

## 2015-01-18 DIAGNOSIS — R1110 Vomiting, unspecified: Secondary | ICD-10-CM | POA: Diagnosis not present

## 2015-01-18 DIAGNOSIS — B182 Chronic viral hepatitis C: Secondary | ICD-10-CM

## 2015-01-18 DIAGNOSIS — K5900 Constipation, unspecified: Secondary | ICD-10-CM | POA: Diagnosis not present

## 2015-01-18 DIAGNOSIS — R109 Unspecified abdominal pain: Secondary | ICD-10-CM | POA: Diagnosis not present

## 2015-01-18 LAB — GI PATHOGEN PANEL BY PCR, STOOL
C difficile toxin A/B: NOT DETECTED
CRYPTOSPORIDIUM BY PCR: NOT DETECTED
Campylobacter by PCR: NOT DETECTED
E COLI (STEC): NOT DETECTED
E coli (ETEC) LT/ST: NOT DETECTED
E coli 0157 by PCR: NOT DETECTED
G lamblia by PCR: NOT DETECTED
Norovirus GI/GII: NOT DETECTED
Rotavirus A by PCR: NOT DETECTED
SALMONELLA BY PCR: NOT DETECTED
Shigella by PCR: NOT DETECTED

## 2015-01-18 MED ORDER — METOCLOPRAMIDE HCL 10 MG PO TABS
5.0000 mg | ORAL_TABLET | Freq: Three times a day (TID) | ORAL | Status: DC
  Administered 2015-01-18 – 2015-01-19 (×4): 5 mg via ORAL
  Filled 2015-01-18 (×4): qty 1

## 2015-01-18 MED ORDER — TECHNETIUM TC 99M SULFUR COLLOID
2.0000 | Freq: Once | INTRAVENOUS | Status: AC | PRN
  Administered 2015-01-18: 2 via ORAL

## 2015-01-18 NOTE — Progress Notes (Signed)
    Subjective: Patient seen in nuclear med between imaging. In middle of 4 hour study. Complains of persistent nausea. No vomiting. 1 BM this morning and then a loose stool. Stomach bubbling.   Objective: Vital signs in last 24 hours: Temp:  [98.9 F (37.2 C)-99.4 F (37.4 C)] 98.9 F (37.2 C) (07/26 0646) Pulse Rate:  [73] 73 (07/26 0646) Resp:  [18-20] 18 (07/26 0646) BP: (133-142)/(75-96) 142/96 mmHg (07/26 0646) SpO2:  [97 %-100 %] 100 % (07/26 0646) Last BM Date: 01/15/15 General:   Alert and oriented, pleasant Head:  Normocephalic and atraumatic. Abdomen:  Bowel sounds present, soft, non-tender, non-distended. No HSM or hernias noted. Limited exam as patient laying on table in nuclear med Extremities:  Without  edema. Neurologic:  Alert and  oriented x4  Psych:  Alert and cooperative. Normal mood and affect.  Intake/Output from previous day: 07/25 0701 - 07/26 0700 In: 1597.1 [P.O.:120; I.V.:1477.1] Out: 2450 [Urine:2450] Intake/Output this shift: Total I/O In: -  Out: 600 [Urine:600]  Lab Results:  Recent Labs  01/16/15 1956 01/17/15 0612  WBC 14.7* 14.3*  HGB 15.3 15.0  HCT 43.3 43.0  PLT 162 184   BMET  Recent Labs  01/16/15 1956 01/17/15 0612  NA 139 136  K 3.8 3.7  CL 103 102  CO2 27 27  GLUCOSE 151* 135*  BUN 13 10  CREATININE 1.22 0.85  CALCIUM 8.5* 8.4*   LFT  Recent Labs  01/17/15 0612  PROT 7.4  ALBUMIN 3.5  AST 41  ALT 62  ALKPHOS 65  BILITOT 1.0   Assessment: 59 year old male with refractory nausea and vomiting with differentials to include uncontrolled GERD, viral illness, marijuana use, delayed gastric emptying, less likely med effect or resolving Hep C. Undergoing gastric emptying study today. Persistent nausea but no vomiting. Needs EGD with Propofol as outpatient.   Hep C antibody positive: PCR and genotype pending. Follow-up with [facility] C clinic as outpatient.   Plan: Await GES findings Follow-up on GI  pathogen panel Continue PPI BID Zofran scheduled with meals Outpatient EGD in 4 weeks with Propofol  Marijuana cessation Outpatient follow-up with Hep C clinic if positive RNA  Nira Retort, ANP-BC [facility]    LOS: 2 days    01/18/2015, 8:05 AM

## 2015-01-18 NOTE — Progress Notes (Signed)
[facility]                                                                              Patient Demographics  Joshua Lester, is a 59 y.o. male, DOB - [birth year], WUJ:[id number]  Admit date - 01/16/2015   Admitting Physician Joshua Singer, MD  Outpatient Primary MD for the patient is No PCP Per Patient  LOS - 2   Chief Complaint  Patient presents with  . Weakness      Summary: 59 year old man discharged 7/23 after being treated for nausea and vomiting which rapidly resolved with supportive care. He returned to the emergency department with nausea, vomiting and was admitted for further evaluation of the same.  Assessment & Plan   Intractable nausea/vomiting -Etiology unclear -CT abdomen/pelvis on 01/12/2015: No definite acute abnormality, scattered diverticulosis without diverticulitis, minimal wall thickening of the distal esophagus could reflect mild chronic inflammation -Gastric emptying study: Delayed gastric emptying -Gastroenterology consulted and appreciated, recommended Zofran with meals, PPI twice a day, marijuana cessation, outpatient EGD in 4 weeks -Pending further recommendations in regards to gastric emptying -Will place patient on clear liquid diet.  Leukocytosis -Unknown etiology possibly reactive -Patient currently is afebrile, no signs of infection -UA negative -Denies SOB/cough  Elevated AST and total bilirubin -Resolved  Positive hepatitis C antibody -Patient will need outpatient follow-up with hepatitis C clinic  Constipation -Resolved  THC use -Counseled  Code Status: Full  Family Communication: None at bedside  Disposition Plan: Admitted.  Pending further recommendations from GI and will place patient on clear liquid diet today.   Time Spent in minutes   30 minutes  Procedures  Gastric emptying study  Consults   Gastroenterology  DVT Prophylaxis  SCDs  Lab Results  Component Value Date   PLT 184 01/17/2015     Medications  Scheduled Meds: . ondansetron  4 mg Oral TID WC & HS  . pantoprazole  40 mg Oral BID AC  . polyethylene glycol  17 g Oral BID  . senna  1 tablet Oral QHS   Continuous Infusions: . sodium chloride 50 mL/hr at 01/17/15 1702   PRN Meds:.oxyCODONE-acetaminophen, promethazine, promethazine  Antibiotics    Anti-infectives    None      Subjective:   [name] seen and examined today.  Patient continues to complain of abdominal discomfort, gas, nausea.  Denies current vomiting.  States he had 3 bowel movements this morning, the first was formed, the last 2 were loose/watery.  Currently denies chest pain, shortness of breath, dizziness, headache.  Objective:   Filed Vitals:   01/17/15 0454 01/17/15 1536 01/17/15 2132 01/18/15 0646  BP: 159/94 133/75 137/87 142/96  Pulse: 85 73 73 73  Temp: 98.7 F (37.1 C) 99.4 F (37.4 C) 99 F (37.2 C) 98.9 F (37.2 C)  TempSrc: Oral Oral Oral Oral  Resp: 20 20 18 18   Height:      Weight:      SpO2: 96% 97% 97% 100%    Wt Readings from Last 3 Encounters:  01/16/15 84.505 kg (186 lb 4.8 oz)  01/13/15 88.451 kg (195 lb)  12/20/14 89.812 kg (198 lb)     Intake/Output  Summary (Last 24 hours) at 01/18/15 1307 Last data filed at 01/18/15 6962  Gross per 24 hour  Intake 1597.08 ml  Output   2350 ml  Net -752.92 ml    Exam  General: Well developed, well nourished, NAD, appears stated age  HEENT: NCAT, mucous membranes moist.   Cardiovascular: S1 S2 auscultated, no rubs, murmurs or gallops. Regular rate and rhythm.  Respiratory: Clear to auscultation bilaterally with equal chest rise  Abdomen: Soft, nontender, nondistended, + bowel sounds  Extremities: warm dry without cyanosis clubbing or edema  Neuro: AAOx3, nonfocal  Psych: Tearful, depressed.  Data Review   Micro Results No results found for this or any previous visit (from the past 240 hour(s)).  Radiology Reports Dg Lumbar Spine 2-3  Views  12/20/2014   CLINICAL DATA:  L5-S1 microdiskectomy  EXAM: LUMBAR SPINE - 2-3 VIEW  COMPARISON:  MRI lumbar spine dated 11/28/2014  FINDINGS: Two intraoperative lateral radiographs of the lumbar spine.  Initial lumbar radiograph demonstrates a surgical probe at the L5 posterior spinous process.  Second lateral radiograph demonstrates surgical hardware at the L5-S1 level.  IMPRESSION: Intraoperative lumbar localization, as above.   Electronically Signed   By: Joshua Lester M.D.   On: 12/20/2014 14:16   Nm Gastric Emptying  01/18/2015   CLINICAL DATA:  Nausea, vomiting.  EXAM: NUCLEAR MEDICINE GASTRIC EMPTYING SCAN  TECHNIQUE: After oral ingestion of radiolabeled meal, sequential abdominal images were obtained for 4 hours. Percentage of activity emptying the stomach was calculated at 1 hour, 2 hour, 3 hour, and 4 hours.  RADIOPHARMACEUTICALS:  2.0 mCi Tc-80m MDP labeled sulfur colloid orally  COMPARISON:  None.  FINDINGS: Expected location of the stomach in the left upper quadrant. Ingested meal empties the stomach gradually over the course of the study.  7.2% emptied at 1 hr ( normal >= 10%)  28.0% emptied at 2 hr ( normal >= 40%)  39.3% emptied at 3 hr ( normal >= 70%)  57.8 emptied at 4 hr ( normal >= 90%)  IMPRESSION: Delayed gastric emptying study.   Electronically Signed   By: Joshua Lester, M.D.   On: 01/18/2015 12:15   Ct Abdomen Pelvis W Contrast  01/12/2015   CLINICAL DATA:  Acute onset of generalized abdominal pain and nausea. Diarrhea. Initial encounter.  EXAM: CT ABDOMEN AND PELVIS WITH CONTRAST  TECHNIQUE: Multidetector CT imaging of the abdomen and pelvis was performed using the standard protocol following bolus administration of intravenous contrast.  CONTRAST:  OMNIPAQUE IOHEXOL 300 MG/ML  SOLN  COMPARISON:  CT of the abdomen and pelvis from 03/07/2012, and MRI of the lumbar spine from 11/28/2014  FINDINGS: The visualized lung bases are clear. Minimal wall thickening along the  distal esophagus could reflect mild chronic inflammation.  The liver and spleen are unremarkable in appearance. The patient is status post cholecystectomy, with clips noted at the gallbladder fossa. The pancreas and adrenal glands are unremarkable.  The kidneys are unremarkable in appearance. There is no evidence of hydronephrosis. No renal or ureteral stones are seen. No perinephric stranding is appreciated.  No free fluid is identified. The small bowel is unremarkable in appearance. The stomach is within normal limits. No acute vascular abnormalities are seen. Scattered calcification is noted along the abdominal aorta and its branches.  The appendix is normal in caliber and contains air without evidence of appendicitis. Scattered diverticulosis is noted along the descending and sigmoid colon, without evidence of diverticulitis.  The bladder is mildly distended  and grossly unremarkable. The prostate remains normal in size, with minimal calcification. No inguinal lymphadenopathy is seen.  No acute osseous abnormalities are identified. Prominent subcortical cystic change is noted at the anterior aspect of both femoral heads. Vacuum phenomenon and mild disc space narrowing are seen at L4-L5, with associated anterior and posterior osteophytes.  IMPRESSION: 1. No definite acute abnormality seen to explain the patient's symptoms. 2. Scattered diverticulosis along the descending and sigmoid colon, without evidence of diverticulitis. 3. Minimal wall thickening along the distal esophagus could reflect mild chronic inflammation. 4. Scattered calcification along the abdominal aorta and its branches. 5. Prominent subcortical cystic change at the anterior aspect of both femoral heads.   Electronically Signed   By: Roanna Lester M.D.   On: 01/12/2015 22:54    CBC  Recent Labs Lab 01/12/15 2033 01/13/15 1055 01/14/15 0637 01/15/15 0614 01/16/15 1956 01/17/15 0612  WBC 12.5* 17.2* 16.8* 13.4* 14.7* 14.3*  HGB 16.7  15.9 14.9 15.8 15.3 15.0  HCT 46.9 45.1 42.6 45.1 43.3 43.0  PLT 209 238 202 186 162 184  MCV 86.4 86.4 86.4 85.7 86.3 85.5  MCH 30.8 30.5 30.2 30.0 30.5 29.8  MCHC 35.6 35.3 35.0 35.0 35.3 34.9  RDW 13.4 13.5 13.4 13.0 13.3 13.3  LYMPHSABS 3.3 2.3  --   --   --   --   MONOABS 0.9 0.6  --   --   --   --   EOSABS 0.1 0.0  --   --   --   --   BASOSABS 0.0 0.0  --   --   --   --     Chemistries   Recent Labs Lab 01/12/15 2033 01/13/15 1055 01/14/15 0637 01/15/15 0614 01/16/15 1956 01/17/15 0612  NA 136 137 135 135 139 136  K 4.1 4.0 3.7 3.5 3.8 3.7  CL 99* 99* 103 101 103 102  CO2 25 26 25 26 27 27   GLUCOSE 153* 156* 145* 108* 151* 135*  BUN 14 13 12 11 13 10   CREATININE 1.09 1.21 0.94 0.85 1.22 0.85  CALCIUM 9.7 9.7 8.7* 8.8* 8.5* 8.4*  AST 46* 43* 50* 50*  --  41  ALT 41 40 50 60  --  62  ALKPHOS 95 90 71 71  --  65  BILITOT 1.2 1.4* 1.4* 1.8*  --  1.0   ------------------------------------------------------------------------------------------------------------------ estimated creatinine clearance is 99.7 mL/min (by C-G formula based on Cr of 0.85). ------------------------------------------------------------------------------------------------------------------ No results for input(s): HGBA1C in the last 72 hours. ------------------------------------------------------------------------------------------------------------------ No results for input(s): CHOL, HDL, LDLCALC, TRIG, CHOLHDL, LDLDIRECT in the last 72 hours. ------------------------------------------------------------------------------------------------------------------ No results for input(s): TSH, T4TOTAL, T3FREE, THYROIDAB in the last 72 hours.  Invalid input(s): FREET3 ------------------------------------------------------------------------------------------------------------------ No results for input(s): VITAMINB12, FOLATE, FERRITIN, TIBC, IRON, RETICCTPCT in the last 72 hours.  Coagulation profile No  results for input(s): INR, PROTIME in the last 168 hours.  No results for input(s): DDIMER in the last 72 hours.  Cardiac Enzymes No results for input(s): CKMB, TROPONINI, MYOGLOBIN in the last 168 hours.  Invalid input(s): CK ------------------------------------------------------------------------------------------------------------------ Invalid input(s): POCBNP    Maame Dack D.O. on 01/18/2015 at 1:07 PM  Between 7am to 7pm - Pager - [phone]  After 7pm go to [email or web address] - password [email or web address]  And look for the night coverage person covering for me after hours  [facility] Office  [phone]

## 2015-01-19 ENCOUNTER — Other Ambulatory Visit: Payer: Self-pay

## 2015-01-19 ENCOUNTER — Telehealth: Payer: Self-pay | Admitting: Gastroenterology

## 2015-01-19 DIAGNOSIS — K5900 Constipation, unspecified: Secondary | ICD-10-CM | POA: Diagnosis not present

## 2015-01-19 DIAGNOSIS — B182 Chronic viral hepatitis C: Secondary | ICD-10-CM | POA: Diagnosis not present

## 2015-01-19 DIAGNOSIS — R1110 Vomiting, unspecified: Secondary | ICD-10-CM | POA: Diagnosis not present

## 2015-01-19 LAB — CBC
HCT: 44.1 % (ref 39.0–52.0)
Hemoglobin: 15.6 g/dL (ref 13.0–17.0)
MCH: 30.5 pg (ref 26.0–34.0)
MCHC: 35.4 g/dL (ref 30.0–36.0)
MCV: 86.3 fL (ref 78.0–100.0)
PLATELETS: 179 10*3/uL (ref 150–400)
RBC: 5.11 MIL/uL (ref 4.22–5.81)
RDW: 13.1 % (ref 11.5–15.5)
WBC: 13.1 10*3/uL — AB (ref 4.0–10.5)

## 2015-01-19 LAB — TSH: TSH: 2.103 u[IU]/mL (ref 0.350–4.500)

## 2015-01-19 LAB — CORTISOL-AM, BLOOD: CORTISOL - AM: 12.8 ug/dL (ref 6.7–22.6)

## 2015-01-19 MED ORDER — METOCLOPRAMIDE HCL 5 MG PO TABS: 5.0000 mg | ORAL_TABLET | Freq: Three times a day (TID) | ORAL | Status: DC

## 2015-01-19 NOTE — Progress Notes (Signed)
Patient is being discharged home.  Is accompanied by family member and being transported to main entrance of Hospital and leaving by personal vehicle.  F/U Appt is given to patient for: Dr. Darrick Penna @ [facility] Short Stay 02/17/2015 @ 11:00am.  Please arrive 15 minutes early.  Also, Endoscopy scheduled for patient at [facility] short Stay 02/22/2015 @ 8:00am.  Patient given Preparation for endoscopy.  Phone numbers provided for both appointments.  Prescription for Reglan given to patient.  A detailed list of all medications was provided.  Patient states understanding of all instructions.  He leaves hospital in stable condition with no complaints.

## 2015-01-19 NOTE — Progress Notes (Signed)
Subjective:  Feels much better. Small bowel movement today. Nonbloody, soft. Tolerated 100% of full liquid diet for dinner last night. Nausea much better. Stomach less tight. Discussed Reglan and potential side effects at length with patient, including risk of permanent tardive dyskinesia. Answered multiple questions regarding gastroparesis and hepatitis C.  Objective: Vital signs in last 24 hours: Temp:  [98.6 F (37 C)-98.9 F (37.2 C)] 98.6 F (37 C) (07/27 0516) Pulse Rate:  [70-82] 70 (07/27 0516) Resp:  [18-19] 18 (07/27 0516) BP: (118-149)/(79-91) 149/91 mmHg (07/27 0516) SpO2:  [94 %-100 %] 100 % (07/27 0516) Last BM Date: 01/18/15 General:   Alert,  Well-developed, well-nourished, pleasant and cooperative in NAD Head:  Normocephalic and atraumatic. Eyes:  Sclera clear, no icterus.  Abdomen:  Soft, nontender and nondistended.   Normal bowel sounds, without guarding, and without rebound.   Extremities:  Without clubbing, deformity or edema. Neurologic:  Alert and  oriented x4;  grossly normal neurologically. Skin:  Intact without significant lesions or rashes. Psych:  Alert and cooperative. Normal mood and affect.  Intake/Output from previous day: 07/26 0701 - 07/27 0700 In: 630 [P.O.:480; I.V.:150] Out: 2700 [Urine:2700] Intake/Output this shift:    Lab Results: CBC  Recent Labs  01/16/15 1956 01/17/15 0612 01/19/15 0614  WBC 14.7* 14.3* 13.1*  HGB 15.3 15.0 15.6  HCT 43.3 43.0 44.1  MCV 86.3 85.5 86.3  PLT 162 184 179   BMET  Recent Labs  01/16/15 1956 01/17/15 0612  NA 139 136  K 3.8 3.7  CL 103 102  CO2 27 27  GLUCOSE 151* 135*  BUN 13 10  CREATININE 1.22 0.85  CALCIUM 8.5* 8.4*   LFTs  Recent Labs  01/17/15 0612  BILITOT 1.0  ALKPHOS 65  AST 41  ALT 62  PROT 7.4  ALBUMIN 3.5   GI pathogen panel negative.  No results for input(s): LIPASE in the last 72 hours. PT/INR No results for input(s): LABPROT, INR in the last 72 hours.     Imaging Studies: Dg Lumbar Spine 2-3 Views  12/20/2014   CLINICAL DATA:  L5-S1 microdiskectomy  EXAM: LUMBAR SPINE - 2-3 VIEW  COMPARISON:  MRI lumbar spine dated 11/28/2014  FINDINGS: Two intraoperative lateral radiographs of the lumbar spine.  Initial lumbar radiograph demonstrates a surgical probe at the L5 posterior spinous process.  Second lateral radiograph demonstrates surgical hardware at the L5-S1 level.  IMPRESSION: Intraoperative lumbar localization, as above.   Electronically Signed   By: Charline Bills M.D.   On: 12/20/2014 14:16   Nm Gastric Emptying  01/18/2015   CLINICAL DATA:  Nausea, vomiting.  EXAM: NUCLEAR MEDICINE GASTRIC EMPTYING SCAN  TECHNIQUE: After oral ingestion of radiolabeled meal, sequential abdominal images were obtained for 4 hours. Percentage of activity emptying the stomach was calculated at 1 hour, 2 hour, 3 hour, and 4 hours.  RADIOPHARMACEUTICALS:  2.0 mCi Tc-82m MDP labeled sulfur colloid orally  COMPARISON:  None.  FINDINGS: Expected location of the stomach in the left upper quadrant. Ingested meal empties the stomach gradually over the course of the study.  7.2% emptied at 1 hr ( normal >= 10%)  28.0% emptied at 2 hr ( normal >= 40%)  39.3% emptied at 3 hr ( normal >= 70%)  57.8 emptied at 4 hr ( normal >= 90%)  IMPRESSION: Delayed gastric emptying study.   Electronically Signed   By: Lupita Raider, M.D.   On: 01/18/2015 12:15   Ct Abdomen Pelvis W Contrast  01/12/2015   CLINICAL DATA:  Acute onset of generalized abdominal pain and nausea. Diarrhea. Initial encounter.  EXAM: CT ABDOMEN AND PELVIS WITH CONTRAST  TECHNIQUE: Multidetector CT imaging of the abdomen and pelvis was performed using the standard protocol following bolus administration of intravenous contrast.  CONTRAST:  OMNIPAQUE IOHEXOL 300 MG/ML  SOLN  COMPARISON:  CT of the abdomen and pelvis from 03/07/2012, and MRI of the lumbar spine from 11/28/2014  FINDINGS: The visualized lung bases are  clear. Minimal wall thickening along the distal esophagus could reflect mild chronic inflammation.  The liver and spleen are unremarkable in appearance. The patient is status post cholecystectomy, with clips noted at the gallbladder fossa. The pancreas and adrenal glands are unremarkable.  The kidneys are unremarkable in appearance. There is no evidence of hydronephrosis. No renal or ureteral stones are seen. No perinephric stranding is appreciated.  No free fluid is identified. The small bowel is unremarkable in appearance. The stomach is within normal limits. No acute vascular abnormalities are seen. Scattered calcification is noted along the abdominal aorta and its branches.  The appendix is normal in caliber and contains air without evidence of appendicitis. Scattered diverticulosis is noted along the descending and sigmoid colon, without evidence of diverticulitis.  The bladder is mildly distended and grossly unremarkable. The prostate remains normal in size, with minimal calcification. No inguinal lymphadenopathy is seen.  No acute osseous abnormalities are identified. Prominent subcortical cystic change is noted at the anterior aspect of both femoral heads. Vacuum phenomenon and mild disc space narrowing are seen at L4-L5, with associated anterior and posterior osteophytes.  IMPRESSION: 1. No definite acute abnormality seen to explain the patient's symptoms. 2. Scattered diverticulosis along the descending and sigmoid colon, without evidence of diverticulitis. 3. Minimal wall thickening along the distal esophagus could reflect mild chronic inflammation. 4. Scattered calcification along the abdominal aorta and its branches. 5. Prominent subcortical cystic change at the anterior aspect of both femoral heads.   Electronically Signed   By: Roanna Raider M.D.   On: 01/12/2015 22:54  [2 weeks]   Assessment: 58 year old male with refractory nausea and vomiting. GES c/w gastroparesis, likely cause idiopathic,  due to anesthesia, narcotics, ?post-viral.  Cannot exclude symptoms in part related to marijuana use. Needs EGD with Propofol as outpatient.   Hep C antibody positive: PCR and genotype pending. Follow-up with [facility] Hep C clinic as outpatient.    Plan: 1. F/U pending cortisol, TSH, HCV labs.  2. Continue low-dose reglan qac/qhs.  3. Consider discharge if tolerates breakfast (low-fat diet). When goes home, continue PPI BID, reglan5mg  qac/qhs,  zofran or phenergan prn.  4. Outpatient EGD in several weeks with propofol if negative UDS. 5. Marijuana cessation.   Leanna Battles. [facility] [phone] 7/27/20168:20 AM    LOS: 3 days

## 2015-01-19 NOTE — Telephone Encounter (Signed)
Pt is set for EGD/ [facility] on 02/22/2015. Pre-op is 02/17/2015 @ 11:00am.  Called [phone] and spoke to [facility] in regards to pt procedure and appointments. Faxed copy to unit and also mailed instructions to pt.

## 2015-01-19 NOTE — Telephone Encounter (Signed)
Patient needs an EGD with deep sedation in the OR (h/o polysubstance abuse/narcotics). Dx: intractable N/V. Please schedule for in 5 weeks.   Please schedule his pre-op in four weeks per Dr. Darrick Penna, patient will need urine drug screen.   Discussed with Candy [place] who will make arrangements.

## 2015-01-19 NOTE — Discharge Summary (Signed)
Physician Discharge Summary  LUMIR DEMETRIOU WUJ:[id number] DOB: [birth year] DOA: 01/16/2015  PCP: No PCP Per Patient  Admit date: 01/16/2015 Discharge date: 01/19/2015  Time spent: 45 minutes  Recommendations for Outpatient Follow-up:  -We discharge home today. -Advised to follow-up with primary care provider in 2 weeks and with Dr. Darrick Penna as scheduled in 4 weeks for outpatient EGD with deep sedation.   Discharge Diagnoses:  Principal Problem:   Intractable nausea and vomiting Active Problems:   Leukocytosis   Marijuana abuse   Hep C w/o coma, chronic   Constipation   Nausea & vomiting   Abdominal discomfort   Discharge Condition: Stable and improved  Filed Weights   01/16/15 1839 01/16/15 2251  Weight: 83.915 kg (185 lb) 84.505 kg (186 lb 4.8 oz)    History of present illness:  This is a 59 year old man who was discharged yesterday from this hospital after having been admitted with same symptoms of intractable nausea and vomiting. It was felt that he likely had a viral gastroenteritis. Minimal wall thickening of the distal esophagus was noted on CT scan, unclear significance. He comes in again because he has continued to have nausea and vomiting today. He denies any abdominal pain, diarrhea or fever. He is now being admitted for further management.  Hospital Course:   Intractable nausea and vomiting -Resolved today after initiation of Reglan. -Tolerating solid diet. -Suspect main etiology is gastroparesis plus use of marijuana. Counseled on cessation of marijuana. -Per GI, not candidate for inpatient EGD given ongoing marijuana use, they have scheduled outpatient EGD in 4-5 weeks.  Constipation -Resolved.  Hepatitis C -We'll follow up outpatient with GI.  Marijuana use -Counseled on cessation.  Procedures:  None   Consultations:  GI, Dr. Darrick Penna  Discharge Instructions  Discharge Instructions    Diet - low sodium heart healthy    Complete by:  As  directed      Increase activity slowly    Complete by:  As directed             Medication List    STOP taking these medications        methocarbamol 500 MG tablet  Commonly known as:  ROBAXIN      TAKE these medications        famotidine 20 MG tablet  Commonly known as:  PEPCID  Take 1 tablet (20 mg total) by mouth at bedtime.     metoCLOPramide 5 MG tablet  Commonly known as:  REGLAN  Take 1 tablet (5 mg total) by mouth 4 (four) times daily -  before meals and at bedtime.     oxyCODONE-acetaminophen 5-325 MG per tablet  Commonly known as:  ROXICET  Take 1-2 tablets by mouth every 6 (six) hours as needed for severe pain.     promethazine 25 MG tablet  Commonly known as:  PHENERGAN  Take 25 mg by mouth every 6 (six) hours as needed.       No Known Allergies    The results of significant diagnostics from this hospitalization (including imaging, microbiology, ancillary and laboratory) are listed below for reference.    Significant Diagnostic Studies: Dg Lumbar Spine 2-3 Views  12/20/2014   CLINICAL DATA:  L5-S1 microdiskectomy  EXAM: LUMBAR SPINE - 2-3 VIEW  COMPARISON:  MRI lumbar spine dated 11/28/2014  FINDINGS: Two intraoperative lateral radiographs of the lumbar spine.  Initial lumbar radiograph demonstrates a surgical probe at the L5 posterior spinous process.  Second lateral radiograph  demonstrates surgical hardware at the L5-S1 level.  IMPRESSION: Intraoperative lumbar localization, as above.   Electronically Signed   By: Charline Bills M.D.   On: 12/20/2014 14:16   Nm Gastric Emptying  01/18/2015   CLINICAL DATA:  Nausea, vomiting.  EXAM: NUCLEAR MEDICINE GASTRIC EMPTYING SCAN  TECHNIQUE: After oral ingestion of radiolabeled meal, sequential abdominal images were obtained for 4 hours. Percentage of activity emptying the stomach was calculated at 1 hour, 2 hour, 3 hour, and 4 hours.  RADIOPHARMACEUTICALS:  2.0 mCi Tc-73m MDP labeled sulfur colloid orally   COMPARISON:  None.  FINDINGS: Expected location of the stomach in the left upper quadrant. Ingested meal empties the stomach gradually over the course of the study.  7.2% emptied at 1 hr ( normal >= 10%)  28.0% emptied at 2 hr ( normal >= 40%)  39.3% emptied at 3 hr ( normal >= 70%)  57.8 emptied at 4 hr ( normal >= 90%)  IMPRESSION: Delayed gastric emptying study.   Electronically Signed   By: Lupita Raider, M.D.   On: 01/18/2015 12:15   Ct Abdomen Pelvis W Contrast  01/12/2015   CLINICAL DATA:  Acute onset of generalized abdominal pain and nausea. Diarrhea. Initial encounter.  EXAM: CT ABDOMEN AND PELVIS WITH CONTRAST  TECHNIQUE: Multidetector CT imaging of the abdomen and pelvis was performed using the standard protocol following bolus administration of intravenous contrast.  CONTRAST:  OMNIPAQUE IOHEXOL 300 MG/ML  SOLN  COMPARISON:  CT of the abdomen and pelvis from 03/07/2012, and MRI of the lumbar spine from 11/28/2014  FINDINGS: The visualized lung bases are clear. Minimal wall thickening along the distal esophagus could reflect mild chronic inflammation.  The liver and spleen are unremarkable in appearance. The patient is status post cholecystectomy, with clips noted at the gallbladder fossa. The pancreas and adrenal glands are unremarkable.  The kidneys are unremarkable in appearance. There is no evidence of hydronephrosis. No renal or ureteral stones are seen. No perinephric stranding is appreciated.  No free fluid is identified. The small bowel is unremarkable in appearance. The stomach is within normal limits. No acute vascular abnormalities are seen. Scattered calcification is noted along the abdominal aorta and its branches.  The appendix is normal in caliber and contains air without evidence of appendicitis. Scattered diverticulosis is noted along the descending and sigmoid colon, without evidence of diverticulitis.  The bladder is mildly distended and grossly unremarkable. The prostate  remains normal in size, with minimal calcification. No inguinal lymphadenopathy is seen.  No acute osseous abnormalities are identified. Prominent subcortical cystic change is noted at the anterior aspect of both femoral heads. Vacuum phenomenon and mild disc space narrowing are seen at L4-L5, with associated anterior and posterior osteophytes.  IMPRESSION: 1. No definite acute abnormality seen to explain the patient's symptoms. 2. Scattered diverticulosis along the descending and sigmoid colon, without evidence of diverticulitis. 3. Minimal wall thickening along the distal esophagus could reflect mild chronic inflammation. 4. Scattered calcification along the abdominal aorta and its branches. 5. Prominent subcortical cystic change at the anterior aspect of both femoral heads.   Electronically Signed   By: Roanna Raider M.D.   On: 01/12/2015 22:54    Microbiology: No results found for this or any previous visit (from the past 240 hour(s)).   Labs: Basic Metabolic Panel:  Recent Labs Lab 01/13/15 1055 01/14/15 0637 01/15/15 0614 01/16/15 1956 01/17/15 0612  NA 137 135 135 139 136  K 4.0 3.7 3.5  3.8 3.7  CL 99* 103 101 103 102  CO2 GLUCOSE 156* 145* 108* 151* 135*  BUN CREATININE 1.21 0.94 0.85 1.22 0.85  CALCIUM 9.7 8.7* 8.8* 8.5* 8.4*   Liver Function Tests:  Recent Labs Lab 01/12/15 2033 01/13/15 1055 01/14/15 0637 01/15/15 0614 01/17/15 0612  AST 46* 43* 50* 50* 41  ALT 41 40 50 60 62  ALKPHOS 95 90 71 71 65  BILITOT 1.2 1.4* 1.4* 1.8* 1.0  PROT 9.0* 8.9* 7.2 7.7 7.4  ALBUMIN 4.2 4.2 3.4* 3.6 3.5    Recent Labs Lab 01/12/15 2033  LIPASE 24   No results for input(s): AMMONIA in the last 168 hours. CBC:  Recent Labs Lab 01/12/15 2033 01/13/15 1055 01/14/15 0637 01/15/15 0614 01/16/15 1956 01/17/15 0612 01/19/15 0614  WBC 12.5* 17.2* 16.8* 13.4* 14.7* 14.3* 13.1*  NEUTROABS 8.3* 14.3*  --   --   --   --   --   HGB 16.7 15.9  14.9 15.8 15.3 15.0 15.6  HCT 46.9 45.1 42.6 45.1 43.3 43.0 44.1  MCV 86.4 86.4 86.4 85.7 86.3 85.5 86.3  PLT 209 238 202 186 162 184 179   Cardiac Enzymes: No results for input(s): CKTOTAL, CKMB, CKMBINDEX, TROPONINI in the last 168 hours. BNP: BNP (last 3 results) No results for input(s): BNP in the last 8760 hours.  ProBNP (last 3 results) No results for input(s): PROBNP in the last 8760 hours.  CBG: No results for input(s): GLUCAP in the last 168 hours.     SignedChaya Jan  [facility] Pager: [phone] 01/19/2015, 10:08 AM

## 2015-01-22 LAB — HCV RNA QUANT RFLX ULTRA OR GENOTYP
HCV RNA Qnt(log copy/mL): 6.064 log10 IU/mL
HepC Qn: 1157710 IU/mL

## 2015-01-22 LAB — HEPATITIS C GENOTYPE

## 2015-01-25 ENCOUNTER — Telehealth: Payer: Self-pay

## 2015-01-25 NOTE — Telephone Encounter (Signed)
I would stop this.  Use Zofran scheduled 30 minutes before meals and at bedtime.

## 2015-01-25 NOTE — Telephone Encounter (Signed)
Pt called and states that [facility] put him on Reglan and when he takes it he gets a headache and at night he is just looking around. Feels like he feel his heartbeat in his ears. Doesn't know if that is a possible side of the medication

## 2015-01-26 NOTE — Telephone Encounter (Signed)
Pt is aware.  

## 2015-01-27 NOTE — Progress Notes (Signed)
Quick Note:  Genotype 1a. Please refer to [facility] Hepatitis C clinic [facility], NP) for treatment. ______

## 2015-01-28 ENCOUNTER — Other Ambulatory Visit: Payer: Self-pay

## 2015-01-28 DIAGNOSIS — K7460 Unspecified cirrhosis of liver: Secondary | ICD-10-CM

## 2015-01-31 ENCOUNTER — Telehealth: Payer: Self-pay

## 2015-01-31 MED ORDER — ONDANSETRON HCL 4 MG PO TABS: 4.0000 mg | ORAL_TABLET | Freq: Three times a day (TID) | ORAL | Status: DC

## 2015-01-31 NOTE — Telephone Encounter (Signed)
Pt left V[facility] that he was told to stop Reglan and start another medication but when he went to the pharmacy nothing else had been sent in. Looks like Gerrit Halls, NP, told him to stop Reglan and start Zofran scheduled. Routing to her to send the prescription in.

## 2015-01-31 NOTE — Telephone Encounter (Signed)
Pt is aware.  

## 2015-01-31 NOTE — Telephone Encounter (Signed)
Completed Zofran.  

## 2015-02-16 NOTE — Patient Instructions (Signed)
Joshua Lester  02/16/2015     @PREFPERIOPPHARMACY @   Your procedure is scheduled on  02/22/2015   Report to [facility] at  745  A.M.  Call this number if you have problems the morning of surgery:  [phone]   Remember:  Do not eat food or drink liquids after midnight.  Take these medicines the morning of surgery with A SIP OF WATER  Pepcid, reglan, zofran, oxycodone   Do not wear jewelry, make-up or nail polish.  Do not wear lotions, powders, or perfumes.   Do not shave 48 hours prior to surgery.  Men may shave face and neck.  Do not bring valuables to the hospital.  [facility] is not responsible for any belongings or valuables.  Contacts, dentures or bridgework may not be worn into surgery.  Leave your suitcase in the car.  After surgery it may be brought to your room.  For patients admitted to the hospital, discharge time will be determined by your treatment team.  Patients discharged the day of surgery will not be allowed to drive home.   Name and phone number of your driver:   family Special instructions:  Follow the diet instructions given to you by Dr Evelina Dun office.  Please read over the following fact sheets that you were given. Pain Booklet, Coughing and Deep Breathing, Surgical Site Infection Prevention, Anesthesia Post-op Instructions and Care and Recovery After Surgery      Esophagogastroduodenoscopy Esophagogastroduodenoscopy (EGD) is a procedure to examine the lining of the esophagus, stomach, and first part of the small intestine (duodenum). A long, flexible, lighted tube with a camera attached (endoscope) is inserted down the throat to view these organs. This procedure is done to detect problems or abnormalities, such as inflammation, bleeding, ulcers, or growths, in order to treat them. The procedure lasts about 5-20 minutes. It is usually an outpatient procedure, but it may need to be performed in emergency cases in the hospital. LET YOUR  CAREGIVER KNOW ABOUT:   Allergies to food or medicine.  All medicines you are taking, including vitamins, herbs, eyedrops, and over-the-counter medicines and creams.  Use of steroids (by mouth or creams).  Previous problems you or members of your family have had with the use of anesthetics.  Any blood disorders you have.  Previous surgeries you have had.  Other health problems you have.  Possibility of pregnancy, if this applies. RISKS AND COMPLICATIONS  Generally, EGD is a safe procedure. However, as with any procedure, complications can occur. Possible complications include:  Infection.  Bleeding.  Tearing (perforation) of the esophagus, stomach, or duodenum.  Difficulty breathing or not being able to breath.  Excessive sweating.  Spasms of the larynx.  Slowed heartbeat.  Low blood pressure. BEFORE THE PROCEDURE  Do not eat or drink anything for 6-8 hours before the procedure or as directed by your caregiver.  Ask your caregiver about changing or stopping your regular medicines.  If you wear dentures, be prepared to remove them before the procedure.  Arrange for someone to drive you home after the procedure. PROCEDURE   A vein will be accessed to give medicines and fluids. A medicine to relax you (sedative) and a pain reliever will be given through that access into the vein.  A numbing medicine (local anesthetic) may be sprayed on your throat for comfort and to stop you from gagging or coughing.  A mouth guard may be placed in your mouth to protect  your teeth and to keep you from biting on the endoscope.  You will be asked to lie on your left side.  The endoscope is inserted down your throat and into the esophagus, stomach, and duodenum.  Air is put through the endoscope to allow your caregiver to view the lining of your esophagus clearly.  The esophagus, stomach, and duodenum is then examined. During the exam, your caregiver may:  Remove tissue to be  examined under a microscope (biopsy) for inflammation, infection, or other medical problems.  Remove growths.  Remove objects (foreign bodies) that are stuck.  Treat any bleeding with medicines or other devices that stop tissues from bleeding (hot cautery, clipping devices).  Widen (dilate) or stretch narrowed areas of the esophagus and stomach.  The endoscope will then be withdrawn. AFTER THE PROCEDURE  You will be taken to a recovery area to be monitored. You will be able to go home once you are stable and alert.  Do not eat or drink anything until the local anesthetic and numbing medicines have worn off. You may choke.  It is normal to feel bloated, have pain with swallowing, or have a sore throat for a short time. This will wear off.  Your caregiver should be able to discuss his or her findings with you. It will take longer to discuss the test results if any biopsies were taken. Document Released: 10/12/2004 Document Revised: 10/26/2013 Document Reviewed: 05/14/2012 [facility] Patient Information 2015 [place], [place]. This information is not intended to replace advice given to you by your health care provider. Make sure you discuss any questions you have with your health care provider. PATIENT INSTRUCTIONS POST-ANESTHESIA  IMMEDIATELY FOLLOWING SURGERY:  Do not drive or operate machinery for the first twenty four hours after surgery.  Do not make any important decisions for twenty four hours after surgery or while taking narcotic pain medications or sedatives.  If you develop intractable nausea and vomiting or a severe headache please notify your doctor immediately.  FOLLOW-UP:  Please make an appointment with your surgeon as instructed. You do not need to follow up with anesthesia unless specifically instructed to do so.  WOUND CARE INSTRUCTIONS (if applicable):  Keep a dry clean dressing on the anesthesia/puncture wound site if there is drainage.  Once the wound has quit draining you  may leave it open to air.  Generally you should leave the bandage intact for twenty four hours unless there is drainage.  If the epidural site drains for more than 36-48 hours please call the anesthesia department.  QUESTIONS?:  Please feel free to call your physician or the hospital operator if you have any questions, and they will be happy to assist you.

## 2015-02-17 ENCOUNTER — Encounter (HOSPITAL_COMMUNITY): Payer: Self-pay

## 2015-02-17 ENCOUNTER — Encounter (HOSPITAL_COMMUNITY)
Admit: 2015-02-17 | Discharge: 2015-02-17 | Disposition: A | Discharge status: Home / Self Care | Payer: BLUE CROSS/BLUE SHIELD | Source: Ambulatory Visit | Attending: Gastroenterology | Admitting: Gastroenterology

## 2015-02-17 DIAGNOSIS — Z01818 Encounter for other preprocedural examination: Secondary | ICD-10-CM | POA: Diagnosis not present

## 2015-02-17 DIAGNOSIS — R112 Nausea with vomiting, unspecified: Secondary | ICD-10-CM | POA: Insufficient documentation

## 2015-02-17 HISTORY — DX: Gastro-esophageal reflux disease without esophagitis: K21.9

## 2015-02-17 LAB — CBC WITH DIFFERENTIAL/PLATELET
BASOS ABS: 0 10*3/uL (ref 0.0–0.1)
Basophils Relative: 0 % (ref 0–1)
EOS PCT: 2 % (ref 0–5)
Eosinophils Absolute: 0.2 10*3/uL (ref 0.0–0.7)
HCT: 41.9 % (ref 39.0–52.0)
Hemoglobin: 14.5 g/dL (ref 13.0–17.0)
LYMPHS ABS: 4 10*3/uL (ref 0.7–4.0)
LYMPHS PCT: 39 % (ref 12–46)
MCH: 30.1 pg (ref 26.0–34.0)
MCHC: 34.6 g/dL (ref 30.0–36.0)
MCV: 86.9 fL (ref 78.0–100.0)
MONO ABS: 0.8 10*3/uL (ref 0.1–1.0)
Monocytes Relative: 7 % (ref 3–12)
Neutro Abs: 5.3 10*3/uL (ref 1.7–7.7)
Neutrophils Relative %: 52 % (ref 43–77)
PLATELETS: 232 10*3/uL (ref 150–400)
RBC: 4.82 MIL/uL (ref 4.22–5.81)
RDW: 13.3 % (ref 11.5–15.5)
WBC: 10.3 10*3/uL (ref 4.0–10.5)

## 2015-02-17 LAB — BASIC METABOLIC PANEL
Anion gap: 5 (ref 5–15)
BUN: 12 mg/dL (ref 6–20)
CO2: 29 mmol/L (ref 22–32)
CREATININE: 1.02 mg/dL (ref 0.61–1.24)
Calcium: 9.7 mg/dL (ref 8.9–10.3)
Chloride: 102 mmol/L (ref 101–111)
GFR calc Af Amer: >60 mL/min (ref >60)
GLUCOSE: 106 mg/dL — AB (ref 65–99)
POTASSIUM: 4.6 mmol/L (ref 3.5–5.1)
Sodium: 136 mmol/L (ref 135–145)

## 2015-02-17 NOTE — Pre-Procedure Instructions (Signed)
Patient given information to sign up for my chart at home. 

## 2015-02-21 ENCOUNTER — Telehealth: Payer: Self-pay

## 2015-02-21 NOTE — Progress Notes (Deleted)
Patient had drug screen during EGD preop. Positive for tetrahydrocannabinol. Dr. Darrick Penna nurse Cloria Spring notified and she will notify physician.

## 2015-02-21 NOTE — Telephone Encounter (Signed)
Victorino Dike called back from Endo and apologized that the drug screen she saw was not current. Pt is supposed to come in and have one prior to procedure tomorrow morning. Clinical staff are aware not to cancel the pt.

## 2015-02-21 NOTE — Telephone Encounter (Signed)
Call from Endo, this pt is on schedule for procedure and his drug screen was positive for Marijuana. Dr. Darrick Penna is off today. Forwarding to Clinical Staff to cancel pt.

## 2015-02-22 ENCOUNTER — Ambulatory Visit (HOSPITAL_COMMUNITY)
Admission: RE | Admit: 2015-02-22 | Discharge: 2015-02-22 | Disposition: A | Discharge status: Home / Self Care | Payer: BLUE CROSS/BLUE SHIELD | Source: Ambulatory Visit | Attending: Gastroenterology | Admitting: Gastroenterology

## 2015-02-22 ENCOUNTER — Encounter (HOSPITAL_COMMUNITY): Payer: Self-pay | Admitting: *Deleted

## 2015-02-22 ENCOUNTER — Ambulatory Visit (HOSPITAL_COMMUNITY): Payer: BLUE CROSS/BLUE SHIELD | Admitting: Anesthesiology

## 2015-02-22 ENCOUNTER — Encounter (HOSPITAL_COMMUNITY)
Admission: RE | Disposition: A | Discharge status: Home / Self Care | Payer: Self-pay | Source: Ambulatory Visit | Attending: Gastroenterology

## 2015-02-22 DIAGNOSIS — K30 Functional dyspepsia: Secondary | ICD-10-CM | POA: Insufficient documentation

## 2015-02-22 DIAGNOSIS — Z79899 Other long term (current) drug therapy: Secondary | ICD-10-CM | POA: Diagnosis not present

## 2015-02-22 DIAGNOSIS — K2980 Duodenitis without bleeding: Secondary | ICD-10-CM | POA: Diagnosis not present

## 2015-02-22 DIAGNOSIS — R1110 Vomiting, unspecified: Secondary | ICD-10-CM

## 2015-02-22 DIAGNOSIS — R112 Nausea with vomiting, unspecified: Secondary | ICD-10-CM | POA: Insufficient documentation

## 2015-02-22 DIAGNOSIS — K319 Disease of stomach and duodenum, unspecified: Secondary | ICD-10-CM | POA: Diagnosis not present

## 2015-02-22 DIAGNOSIS — K219 Gastro-esophageal reflux disease without esophagitis: Secondary | ICD-10-CM | POA: Diagnosis not present

## 2015-02-22 DIAGNOSIS — K2970 Gastritis, unspecified, without bleeding: Secondary | ICD-10-CM | POA: Insufficient documentation

## 2015-02-22 HISTORY — PX: ESOPHAGOGASTRODUODENOSCOPY (EGD) WITH PROPOFOL: SHX5813

## 2015-02-22 HISTORY — PX: BIOPSY: SHX5522

## 2015-02-22 LAB — RAPID URINE DRUG SCREEN, HOSP PERFORMED
AMPHETAMINES: NOT DETECTED
BENZODIAZEPINES: NOT DETECTED
Barbiturates: NOT DETECTED
COCAINE: NOT DETECTED
OPIATES: POSITIVE — AB
Tetrahydrocannabinol: POSITIVE — AB

## 2015-02-22 SURGERY — ESOPHAGOGASTRODUODENOSCOPY (EGD) WITH PROPOFOL
Anesthesia: Monitor Anesthesia Care

## 2015-02-22 MED ORDER — PROPOFOL INFUSION 10 MG/ML OPTIME
INTRAVENOUS | Status: DC | PRN
  Administered 2015-02-22: 125 ug/kg/min via INTRAVENOUS

## 2015-02-22 MED ORDER — FENTANYL CITRATE (PF) 100 MCG/2ML IJ SOLN
25.0000 ug | INTRAMUSCULAR | Status: AC
  Administered 2015-02-22 (×2): 25 ug via INTRAVENOUS
  Filled 2015-02-22: qty 2

## 2015-02-22 MED ORDER — PROPOFOL 10 MG/ML IV BOLUS
INTRAVENOUS | Status: AC
  Filled 2015-02-22: qty 20

## 2015-02-22 MED ORDER — LIDOCAINE VISCOUS 2 % MT SOLN
OROMUCOSAL | Status: DC | PRN
  Administered 2015-02-22: 5 mL via OROMUCOSAL

## 2015-02-22 MED ORDER — ONDANSETRON HCL 4 MG/2ML IJ SOLN
4.0000 mg | Freq: Once | INTRAMUSCULAR | Status: AC
  Administered 2015-02-22: 4 mg via INTRAVENOUS
  Filled 2015-02-22: qty 2

## 2015-02-22 MED ORDER — OMEPRAZOLE 20 MG PO CPDR: DELAYED_RELEASE_CAPSULE | ORAL | Status: DC

## 2015-02-22 MED ORDER — STERILE WATER FOR IRRIGATION IR SOLN
Status: DC | PRN
  Administered 2015-02-22: 1000 mL

## 2015-02-22 MED ORDER — LACTATED RINGERS IV SOLN
INTRAVENOUS | Status: DC
  Administered 2015-02-22: 1000 mL via INTRAVENOUS

## 2015-02-22 MED ORDER — MIDAZOLAM HCL 2 MG/2ML IJ SOLN
1.0000 mg | INTRAMUSCULAR | Status: DC | PRN
  Administered 2015-02-22: 2 mg via INTRAVENOUS
  Filled 2015-02-22: qty 2

## 2015-02-22 SURGICAL SUPPLY — 20 items
BLOCK BITE 60FR ADLT L/F BLUE (MISCELLANEOUS) ×1 IMPLANT
ELECT REM PT RETURN 9FT ADLT (ELECTROSURGICAL)
ELECTRODE REM PT RTRN 9FT ADLT (ELECTROSURGICAL) IMPLANT
FLOOR PAD 36X40 (MISCELLANEOUS) ×2
FORCEPS BIOP RAD 4 LRG CAP 4 (CUTTING FORCEPS) IMPLANT
FORMALIN 10 PREFIL 20ML (MISCELLANEOUS) ×1 IMPLANT
KIT ENDO PROCEDURE PEN (KITS) ×2 IMPLANT
MANIFOLD NEPTUNE II (INSTRUMENTS) ×2 IMPLANT
NDL SCLEROTHERAPY 25GX240 (NEEDLE) IMPLANT
NEEDLE SCLEROTHERAPY 25GX240 (NEEDLE) IMPLANT
OVERTUBE ENDOCUFF GREEN (MISCELLANEOUS) ×1 IMPLANT
PAD FLOOR 36X40 (MISCELLANEOUS) IMPLANT
PROBE APC STR FIRE (PROBE) IMPLANT
PROBE INJECTION GOLD (MISCELLANEOUS)
PROBE INJECTION GOLD 7FR (MISCELLANEOUS) IMPLANT
SNARE SHORT THROW 13M SML OVAL (MISCELLANEOUS) IMPLANT
SYR 50ML LL SCALE MARK (SYRINGE) ×1 IMPLANT
SYR INFLATION 60ML (SYRINGE) IMPLANT
TUBING IRRIGATION ENDOGATOR (MISCELLANEOUS) ×1 IMPLANT
WATER STERILE IRR 1000ML POUR (IV SOLUTION) ×1 IMPLANT

## 2015-02-22 NOTE — Transfer of Care (Signed)
Immediate Anesthesia Transfer of Care Note  Patient: Joshua Lester  Procedure(s) Performed: Procedure(s): ESOPHAGOGASTRODUODENOSCOPY (EGD) WITH PROPOFOL (N/A) BIOPSY (N/A)  Patient Location: PACU  Anesthesia Type:MAC  Level of Consciousness: sedated  Airway & Oxygen Therapy: Patient Spontanous Breathing and Patient connected to nasal cannula oxygen  Post-op Assessment: Report given to RN  Post vital signs: Reviewed and stable  Last Vitals:  Filed Vitals:   02/22/15 0915  BP: 123/75  Temp:   Resp: 12    Complications: No apparent anesthesia complications

## 2015-02-22 NOTE — Anesthesia Postprocedure Evaluation (Signed)
  Anesthesia Post-op Note  Patient: Joshua Lester  Procedure(s) Performed: Procedure(s): ESOPHAGOGASTRODUODENOSCOPY (EGD) WITH PROPOFOL (N/A) BIOPSY (N/A)  Patient Location: PACU  Anesthesia Type:MAC  Level of Consciousness: awake and oriented  Airway and Oxygen Therapy: Patient Spontanous Breathing and Patient connected to nasal cannula oxygen  Post-op Pain: none  Post-op Assessment: Post-op Vital signs reviewed, Patient's Cardiovascular Status Stable, Respiratory Function Stable, Patent Airway and No signs of Nausea or vomiting              Post-op Vital Signs: Reviewed and stable  Last Vitals:  Filed Vitals:   02/22/15 0915  BP: 123/75  Temp:   Resp: 12    Complications: No apparent anesthesia complications

## 2015-02-22 NOTE — Discharge Instructions (Signed)
YOUR UPPER ABDOMINAL PAIN & NAUSEA/VOMITING WERE MOST LIKELY DUE TO gastritis/REFLUX. YOU HAVE A SMALL HIATAL HERNIA. I biopsied your stomach.   DRINK WATER TO KEEP YOUR URINE LIGHT YELLOW.  FOLLOW A LOW FAT DIET. MEATS SHOULD BE BAKED, BROILED, OR BOILED.  AVOID TRIGGERS FOR REFLUX AND GASTRITIS. SEE INFO BELOW.  START OMEPRAZOLE.  TAKE 30 MINUTES PRIOR TO YOUR MEALS TWICE DAILY FOR 3 MOS THEN ONCE DAILY.  CONTINUE YOUR WEIGHT LOSS EFFORTS.  LOSE 10 LBS.  YOUR BIOPSY RESULTS WILL BE AVAILABLE IN MY CHART AFTER SEP 1 AND MY OFFICE WILL CONTACT YOU IN 10-14 DAYS WITH YOUR RESULTS.   REPEAT HEP C viral load IN ONE WEEK PRIOR TO OUTPATIENT VISIT IN FEB 2017.    UPPER ENDOSCOPY AFTER CARE Read the instructions outlined below and refer to this sheet in the next week. These discharge instructions provide you with general information on caring for yourself after you leave the hospital. While your treatment has been planned according to the most current medical practices available, unavoidable complications occasionally occur. If you have any problems or questions after discharge, call DR. Nyeem Stoke, [phone].  ACTIVITY  You may resume your regular activity, but move at a slower pace for the next 24 hours.   Take frequent rest periods for the next 24 hours.   Walking will help get rid of the air and reduce the bloated feeling in your belly (abdomen).   No driving for 24 hours (because of the medicine (anesthesia) used during the test).   You may shower.   Do not sign any important legal documents or operate any machinery for 24 hours (because of the anesthesia used during the test).    NUTRITION  Drink plenty of fluids.   You may resume your normal diet as instructed by your doctor.   Begin with a light meal and progress to your normal diet. Heavy or fried foods are harder to digest and may make you feel sick to your stomach (nauseated).   Avoid alcoholic beverages for 24 hours  or as instructed.    MEDICATIONS  You may resume your normal medications.   WHAT YOU CAN EXPECT TODAY  Some feelings of bloating in the abdomen.   Passage of more gas than usual.    IF YOU HAD A BIOPSY TAKEN DURING THE UPPER ENDOSCOPY:  Eat a soft diet IF YOU HAVE NAUSEA, BLOATING, ABDOMINAL PAIN, OR VOMITING.    FINDING OUT THE RESULTS OF YOUR TEST Not all test results are available during your visit. DR. Darrick Penna WILL CALL YOU WITHIN 14 DAYS OF YOUR PROCEDUE WITH YOUR RESULTS. Do not assume everything is normal if you have not heard from DR. Charlize Hathaway, CALL HER OFFICE AT [phone].  SEEK IMMEDIATE MEDICAL ATTENTION AND CALL THE OFFICE: [phone] IF:  You have more than a spotting of blood in your stool.   Your belly is swollen (abdominal distention).   You are nauseated or vomiting.   You have a temperature over 101F.   You have abdominal pain or discomfort that is severe or gets worse throughout the day.   Gastritis  Gastritis is an inflammation (the body's way of reacting to injury and/or infection) of the stomach. It is often caused by bacterial (germ) infections. It can also be caused BY ASPIRIN, BC/GOODY POWDER'S, (IBUPROFEN) MOTRIN, OR ALEVE (NAPROXEN), chemicals (including alcohol), SPICY FOODS, and medications. This illness may be associated with generalized malaise (feeling tired, not well), UPPER ABDOMINAL STOMACH cramps, and fever. One common  bacterial cause of gastritis is an organism known as H. Pylori. This can be treated with antibiotics.    REFLUX   TREATMENT There are a number of medicines used to treat reflux including: Antacids.  ZANTAC Proton-pump inhibitors: PRILOSEC (OMEPRAZOLE)  HOME CARE INSTRUCTIONS Eat 2-3 hours before going to bed.  Try to reach and maintain a healthy weight. LOSE 10-20 LBS Do not eat just a few very large meals. Instead, eat 4 TO 6 smaller meals throughout the day.  Try to identify foods and beverages that make your  symptoms worse, and avoid these.  Avoid tight clothing.  Do not exercise right after eating.   Low-Fat Diet BREADS, CEREALS, PASTA, RICE, DRIED PEAS, AND BEANS These products are high in carbohydrates and most are low in fat. Therefore, they can be increased in the diet as substitutes for fatty foods. They too, however, contain calories and should not be eaten in excess. Cereals can be eaten for snacks as well as for breakfast.  Include foods that contain fiber (fruits, vegetables, whole grains, and legumes). Research shows that fiber may lower blood cholesterol levels, especially the water-soluble fiber found in fruits, vegetables, oat products, and legumes. FRUITS AND VEGETABLES It is good to eat fruits and vegetables. Besides being sources of fiber, both are rich in vitamins and some minerals. They help you get the daily allowances of these nutrients. Fruits and vegetables can be used for snacks and desserts. MEATS Limit lean meat, chicken, [place], and fish to no more than 6 ounces per day. Beef, Pork, and Lamb Use lean cuts of beef, pork, and lamb. Lean cuts include:  Extra-lean ground beef.  Arm roast.  Sirloin tip.  Center-cut ham.  Round steak.  Loin chops.  Rump roast.  Tenderloin.  Trim all fat off the outside of meats before cooking. It is not necessary to severely decrease the intake of red meat, but lean choices should be made. Lean meat is rich in protein and contains a highly absorbable form of iron. Premenopausal women, in particular, should avoid reducing lean red meat because this could increase the risk for low red blood cells (iron-deficiency anemia).  Chicken and [place] These are good sources of protein. The fat of poultry can be reduced by removing the skin and underlying fat layers before cooking. Chicken and [place] can be substituted for lean red meat in the diet. Poultry should not be fried or covered with high-fat sauces. Fish and Shellfish Fish is a good source  of protein. Shellfish contain cholesterol, but they usually are low in saturated fatty acids. The preparation of fish is important. Like chicken and [place], they should not be fried or covered with high-fat sauces. EGGS Egg whites contain no fat or cholesterol. They can be eaten often. Try 1 to 2 egg whites instead of whole eggs in recipes or use egg substitutes that do not contain yolk.  MILK AND DAIRY PRODUCTS Use skim or 1% milk instead of 2% or whole milk. Decrease whole milk, natural, and processed cheeses. Use nonfat or low-fat (2%) cottage cheese or low-fat cheeses made from vegetable oils. Choose nonfat or low-fat (1 to 2%) yogurt. Experiment with evaporated skim milk in recipes that call for heavy cream. Substitute low-fat yogurt or low-fat cottage cheese for sour cream in dips and salad dressings. Have at least 2 servings of low-fat dairy products, such as 2 glasses of skim (or 1%) milk each day to help get your daily calcium intake.  FATS AND OILS Butterfat,  lard, and beef fats are high in saturated fat and cholesterol. These should be avoided.Vegetable fats do not contain cholesterol. AVOID coconut oil, palm oil, and palm kernel oil, WHICH are very high in saturated fats. These should be limited. These fats are often used in bakery goods, processed foods, popcorn, oils, and nondairy creamers. Vegetable shortenings and some peanut butters contain hydrogenated oils, which are also saturated fats. Read the labels on these foods and check for saturated vegetable oils.  Desirable liquid vegetable oils are corn oil, cottonseed oil, olive oil, canola oil, safflower oil, soybean oil, and sunflower oil. Peanut oil is not as good, but small amounts are acceptable. Buy a heart-healthy tub margarine that has no partially hydrogenated oils in the ingredients. AVOID Mayonnaise and salad dressings often are made from unsaturated fats.  OTHER EATING TIPS Snacks  Most sweets should be limited as snacks.  They tend to be rich in calories and fats, and their caloric content outweighs their nutritional value. Some good choices in snacks are graham crackers, melba toast, soda crackers, bagels (no egg), English muffins, fruits, and vegetables. These snacks are preferable to snack crackers, [place] fries, and chips. Popcorn should be air-popped or cooked in small amounts of liquid vegetable oil.  Desserts Eat fruit, low-fat yogurt, and fruit ices instead of pastries, cake, and cookies. Sherbet, angel food cake, gelatin dessert, frozen low-fat yogurt, or other frozen products that do not contain saturated fat (pure fruit juice bars, frozen ice pops) are also acceptable.   COOKING METHODS Choose those methods that use little or no fat. They include: Poaching.  Braising.  Steaming.  Grilling.  Baking.  Stir-frying.  Broiling.  Microwaving.  Foods can be cooked in a nonstick pan without added fat, or use a nonfat cooking spray in regular cookware. Limit fried foods and avoid frying in saturated fat. Add moisture to lean meats by using water, broth, cooking wines, and other nonfat or low-fat sauces along with the cooking methods mentioned above. Soups and stews should be chilled after cooking. The fat that forms on top after a few hours in the refrigerator should be skimmed off. When preparing meals, avoid using excess salt. Salt can contribute to raising blood pressure in some people.  EATING AWAY FROM HOME Order entres, potatoes, and vegetables without sauces or butter. When meat exceeds the size of a deck of cards (3 to 4 ounces), the rest can be taken home for another meal. Choose vegetable or fruit salads and ask for low-calorie salad dressings to be served on the side. Use dressings sparingly. Limit high-fat toppings, such as bacon, crumbled eggs, cheese, sunflower seeds, and olives. Ask for heart-healthy tub margarine instead of butter.  Hiatal Hernia A hiatal hernia occurs when a part of the  stomach slides above the diaphragm. The diaphragm is the thin muscle separating the belly (abdomen) from the chest. A hiatal hernia can be something you are born with or develop over time. Hiatal hernias may allow stomach acid to flow back into your esophagus, the tube which carries food from your mouth to your stomach. If this acid causes problems it is called GERD (gastro-esophageal reflux disease).   SYMPTOMS Common symptoms of GERD are heartburn (burning in your chest). This is worse when lying down or bending over. It may also cause belching and indigestion. Some of the things which make GERD worse are:  Increased weight pushes on stomach making acid rise more easily.   Smoking markedly increases acid production.   Alcohol  decreases lower esophageal sphincter pressure (valve between stomach and esophagus), allowing acid from stomach into esophagus.   Late evening meals and going to bed with a full stomach increases pressure.   HOME CARE INSTRUCTIONS  Try to achieve and maintain an ideal body weight.   Avoid drinking alcoholic beverages.   DO NOT smokE.   Do not wear tight clothing around your chest or stomach.   Eat smaller meals and eat more frequently. This keeps your stomach from getting too full. Eat slowly.   Do not lie down for 2 or 3 hours after eating. Do not eat or drink anything 1 to 2 hours before going to bed.   Avoid caffeine beverages (colas, coffee, cocoa, tea), fatty foods, citrus fruits and all other foods and drinks that contain acid and that seem to increase the problems.   Avoid bending over, especially after eating OR STRAINING. Anything that increases the pressure in your belly increases the amount of acid that may be pushed up into your esophagus.

## 2015-02-22 NOTE — Telephone Encounter (Signed)
REVIEWED-NO ADDITIONAL RECOMMENDATIONS. 

## 2015-02-22 NOTE — Anesthesia Preprocedure Evaluation (Signed)
Anesthesia Evaluation  Patient identified by MRN, date of birth, ID band Patient awake    Reviewed: Allergy & Precautions, NPO status , Patient's Chart, lab work & pertinent test results  History of Anesthesia Complications Negative for: history of anesthetic complications  Airway Mallampati: II  TM Distance: >3 FB Neck ROM: Full  Mouth opening: Limited Mouth Opening  Dental  (+) Teeth Intact, Chipped, Dental Advisory Given   Pulmonary former smoker,  breath sounds clear to auscultation        Cardiovascular negative cardio ROS  Rhythm:Regular Rate:Normal     Neuro/Psych negative neurological ROS     GI/Hepatic GERD-  Medicated,(+) Hepatitis -, C  Endo/Other  negative endocrine ROS  Renal/GU negative Renal ROS     Musculoskeletal  (+) Arthritis -,   Abdominal (+) + obese,   Peds  Hematology   Anesthesia Other Findings   Reproductive/Obstetrics                             Anesthesia Physical Anesthesia Plan  ASA: III  Anesthesia Plan: MAC   Post-op Pain Management:    Induction: Intravenous  Airway Management Planned: Simple Face Mask  Additional Equipment:   Intra-op Plan:   Post-operative Plan:   Informed Consent: I have reviewed the patients History and Physical, chart, labs and discussed the procedure including the risks, benefits and alternatives for the proposed anesthesia with the patient or authorized representative who has indicated his/her understanding and acceptance.     Plan Discussed with:   Anesthesia Plan Comments:         Anesthesia Quick Evaluation

## 2015-02-22 NOTE — H&P (Signed)
  Primary Care Physician:  No PCP Per Patient Primary Gastroenterologist:  Dr. Darrick Penna  Pre-Procedure History & Physical: HPI:  Joshua Lester is a 59 y.o. male here for DYSPEPSIA-NAUSEA/VOMITING.  Past Medical History  Diagnosis Date  . Lumbar herniated disc   . Diverticulosis   . Marijuana abuse 01/16/2015  . Hepatitis C, chronic JUL 2016  . GERD (gastroesophageal reflux disease)     Past Surgical History  Procedure Laterality Date  . Facial reconstruction surgery    . Cholecystectomy  2011  . Lumbar laminectomy/decompression microdiscectomy N/A 12/20/2014    Procedure: L5-S1 Microdiscectomy, Bilateral;  Surgeon: Eldred Manges, MD;  Location: [facility] OR;  Service: Orthopedics;  Laterality: N/A;  . Tracheostomy    . Colonoscopy      Dr. Loreta Ave     Prior to Admission medications   Medication Sig Start Date End Date Taking? Authorizing Provider  ondansetron (ZOFRAN) 4 MG tablet Take 1 tablet (4 mg total) by mouth 3 (three) times daily with meals. 01/31/15  Yes Nira Retort, NP  oxyCODONE-acetaminophen (ROXICET) 5-325 MG per tablet Take 1-2 tablets by mouth every 6 (six) hours as needed for severe pain. 12/20/14  Yes Naida Sleight, PA-C  famotidine (PEPCID) 20 MG tablet Take 1 tablet (20 mg total) by mouth at bedtime. Patient not taking: Reported on 02/09/2015 01/15/15   Standley Brooking, MD  metoCLOPramide (REGLAN) 5 MG tablet Take 1 tablet (5 mg total) by mouth 4 (four) times daily -  before meals and at bedtime. Patient not taking: Reported on 02/09/2015 01/19/15   Henderson Cloud, MD    Allergies as of 01/19/2015  . (No Known Allergies)    Family History  Problem Relation Age of Onset  . Colon cancer Neg Hx     Social History   Social History  . Marital Status: Married    Spouse Name: N/A  . Number of Children: N/A  . Years of Education: N/A   Occupational History  . Not on file.   Social History Main Topics  . Smoking status: Former Smoker -- 0.50 packs/day  for 40 years    Types: Cigarettes    Quit date: 01/13/2012  . Smokeless tobacco: Not on file  . Alcohol Use: No     Comment: no ETOH for 25 years  . Drug Use: Yes    Special: Marijuana     Comment: none x couple weeks 01/16/15. History of IV drug use as a teenager  . Sexual Activity: Yes   Other Topics Concern  . Not on file   Social History Narrative    Review of Systems: See HPI, otherwise negative ROS   Physical Exam: BP 137/88 mmHg  Temp(Src) 98 F (36.7 C) (Oral)  Resp 18  Ht  (1.702 m)  Wt 192 lb (87.091 kg)  BMI 30.06 kg/m2  SpO2 94% General:   Alert,  pleasant and cooperative in NAD Head:  Normocephalic and atraumatic. Neck:  Supple; Lungs:  Clear throughout to auscultation.    Heart:  Regular rate and rhythm. Abdomen:  Soft, nontender and nondistended. Normal bowel sounds, without guarding, and without rebound.   Neurologic:  Alert and  oriented x4;  grossly normal neurologically.  Impression/Plan:     DYSPEPSIA-NAUSEA/VOMITING.  PLAN:  EGD TODAY

## 2015-02-22 NOTE — Op Note (Signed)
[facility] [address] [place], [address]   ENDOSCOPY PROCEDURE REPORT  PATIENT: Joshua Lester, Joshua Lester  MR#: [id number] BIRTHDATE: 12-23-1955 , 59  yrs. old GENDER: male  ENDOSCOPIST: West Bali, MD REFERRED BY: PROCEDURE DATE: March 05, 2015 PROCEDURE:   EGD w/ biopsy  INDICATIONS:nausea.   vomiting.   GES JUL 2016 SHOWED DELAYED GASTRIC EMPTYING AT 4 HRS-32 % RETENTION, NL:< 10%).  TAKING ZOFRAN AND SYMPTOMS IMPROVED.  NOT SMOKING THC. HEP C RNA POS JUL 2016 MEDICATIONS: Monitored anesthesia care TOPICAL ANESTHETIC:   Viscous Xylocaine ASA CLASS:  DESCRIPTION OF PROCEDURE:     Physical exam was performed.  Informed consent was obtained from the patient after explaining the benefits, risks, and alternatives to the procedure.  The patient was connected to the monitor and placed in the left lateral position.  Continuous oxygen was provided by nasal cannula and IV medicine administered through an indwelling cannula.  After administration of sedation, the patients esophagus was intubated and the     endoscope was advanced under direct visualization to the second portion of the duodenum.  The scope was removed slowly by carefully examining the color, texture, anatomy, and integrity of the mucosa on the way out.  The patient was recovered in endoscopy and discharged home in satisfactory condition.  Estimated blood loss is zero unless otherwise noted in this procedure report.     ESOPHAGUS: The mucosa of the esophagus appeared normal.   NO ESOPHAGEAL VARICES.   STOMACH: Mild non-erosive gastritis (inflammation) was found in the gastric antrum and gastric body. Multiple biopsies were performed.   NO VARICES IN FUNDUS OR CARDIA.DUODENUM: Mild duodenal inflammation was found in the duodenal bulb.   The duodenal mucosa showed no abnormalities in the 2nd part of the duodenum. COMPLICATIONS: There were no immediate complications.  ENDOSCOPIC IMPRESSION: 1.    NAUSEA AND VOMITING DUE TO MILD GASTROPARESIS, GERD, AND GASTRITIS/DUODENITIS  RECOMMENDATIONS: DRINK WATER TO KEEP YOUR URINE LIGHT YELLOW. FOLLOW A LOW FAT DIET. AVOID TRIGGERS FOR REFLUX AND GASTRITIS.  S START OMEPRAZOLE 30 MINUTES PRIOR TO MEALS BID. CONTINUE WEIGHT LOSS EFFORTS.  LOSE 10 LBS. AWAIT BIOPSY RESULTS. REPEAT HEP C viral load ONE WEEK PRIOR TO OUTPATIENT VISIT IN FEB 2017.  REPEAT EXAM:  eSigned:  West Bali, MD March 05, 2015 1:27 PM CPT CODES: ICD CODES:  The ICD and CPT codes recommended by this software are interpretations from the data that the clinical staff has captured with the software.  The verification of the translation of this report to the ICD and CPT codes and modifiers is the sole responsibility of the health care institution and practicing physician where this report was generated.  [employer] will not be held responsible for the validity of the ICD and CPT codes included on this report.  AMA assumes no liability for data contained or not contained herein. CPT is a Publishing rights manager of the [employer].

## 2015-02-23 ENCOUNTER — Encounter (HOSPITAL_COMMUNITY): Payer: Self-pay | Admitting: Gastroenterology

## 2015-03-02 ENCOUNTER — Other Ambulatory Visit: Payer: Self-pay | Admitting: Orthopaedic Surgery

## 2015-03-02 DIAGNOSIS — M542 Cervicalgia: Secondary | ICD-10-CM

## 2015-03-03 ENCOUNTER — Telehealth: Payer: Self-pay | Admitting: Gastroenterology

## 2015-03-03 NOTE — Telephone Encounter (Signed)
LMOM to call.

## 2015-03-03 NOTE — Telephone Encounter (Signed)
ON RECALL LIST  °

## 2015-03-03 NOTE — Telephone Encounter (Signed)
Please call pt. HIS stomach Bx shows gastritis.    DRINK WATER TO KEEP YOUR URINE LIGHT YELLOW.  FOLLOW A LOW FAT DIET. MEATS SHOULD BE BAKED, BROILED, OR BOILED.  AVOID TRIGGERS FOR REFLUX AND GASTRITIS. SEE INFO BELOW.  START OMEPRAZOLE.  TAKE 30 MINUTES PRIOR TO YOUR MEALS TWICE DAILY FOR 3 MOS THEN ONCE DAILY.  CONTINUE YOUR WEIGHT LOSS EFFORTS.  LOSE 10 LBS.  REPEAT HEP C viral load IN ONE WEEK PRIOR TO OUTPATIENT VISIT IN FEB 2017 E30 HEP C, NAUSEA/VOMITING

## 2015-03-03 NOTE — Telephone Encounter (Signed)
Requested medical records from [facility] office fax [phone]

## 2015-03-03 NOTE — Telephone Encounter (Signed)
OBTAIN TCS FROM REPORT FOR DR. MANN IN [place].

## 2015-03-04 ENCOUNTER — Telehealth: Payer: Self-pay | Admitting: Gastroenterology

## 2015-03-04 NOTE — Telephone Encounter (Signed)
Noted  

## 2015-03-04 NOTE — Telephone Encounter (Signed)
Pt called and was informed of the results.  

## 2015-03-04 NOTE — Telephone Encounter (Signed)
Letter mailed to pt to call for results.  

## 2015-03-04 NOTE — Telephone Encounter (Signed)
I requested medical records yesterday, but I will need a signed release from [facility]. I mailed release for patient to sign and mail back to me.

## 2015-03-06 ENCOUNTER — Ambulatory Visit
Admission: RE | Admit: 2015-03-06 | Discharge: 2015-03-06 | Disposition: A | Discharge status: Home / Self Care | Payer: BLUE CROSS/BLUE SHIELD | Source: Ambulatory Visit | Attending: Orthopaedic Surgery | Admitting: Orthopaedic Surgery

## 2015-03-06 DIAGNOSIS — M542 Cervicalgia: Secondary | ICD-10-CM

## 2015-03-08 NOTE — Telephone Encounter (Signed)
REVIEWED-NO ADDITIONAL RECOMMENDATIONS. 

## 2015-03-25 ENCOUNTER — Other Ambulatory Visit (HOSPITAL_COMMUNITY): Payer: Self-pay | Admitting: Nurse Practitioner

## 2015-03-25 DIAGNOSIS — B182 Chronic viral hepatitis C: Secondary | ICD-10-CM

## 2015-04-06 ENCOUNTER — Ambulatory Visit (HOSPITAL_COMMUNITY)
Admission: RE | Admit: 2015-04-06 | Discharge: 2015-04-06 | Disposition: A | Discharge status: Home / Self Care | Payer: BLUE CROSS/BLUE SHIELD | Source: Ambulatory Visit | Attending: Nurse Practitioner | Admitting: Nurse Practitioner

## 2015-04-06 DIAGNOSIS — Z9049 Acquired absence of other specified parts of digestive tract: Secondary | ICD-10-CM | POA: Insufficient documentation

## 2015-04-06 DIAGNOSIS — B182 Chronic viral hepatitis C: Secondary | ICD-10-CM | POA: Insufficient documentation

## 2015-04-14 ENCOUNTER — Ambulatory Visit (HOSPITAL_COMMUNITY): Payer: BLUE CROSS/BLUE SHIELD

## 2015-07-11 ENCOUNTER — Encounter: Payer: Self-pay | Admitting: Gastroenterology

## 2015-09-14 ENCOUNTER — Telehealth: Payer: Self-pay

## 2015-09-14 ENCOUNTER — Encounter: Payer: Self-pay | Admitting: Gastroenterology

## 2015-09-14 NOTE — Telephone Encounter (Signed)
APPT MADE AND LETTER SENT  °

## 2015-09-14 NOTE — Telephone Encounter (Signed)
Pt called and asked if he could go back on his regular diet. I told him we went a letter in Jan for him to call and schedule an appt. He said OK to schedule an appt and he can discuss his concerns at office visit.

## 2015-10-05 ENCOUNTER — Encounter: Payer: Self-pay | Admitting: Gastroenterology

## 2015-10-05 ENCOUNTER — Ambulatory Visit (INDEPENDENT_AMBULATORY_CARE_PROVIDER_SITE_OTHER): Payer: BLUE CROSS/BLUE SHIELD | Admitting: Gastroenterology

## 2015-10-05 VITALS — BP 142/93 | HR 82 | Temp 98.6°F | Ht 67.0 in | Wt 204.0 lb

## 2015-10-05 DIAGNOSIS — K3184 Gastroparesis: Secondary | ICD-10-CM | POA: Insufficient documentation

## 2015-10-05 MED ORDER — PANTOPRAZOLE SODIUM 40 MG PO TBEC: 40.0000 mg | DELAYED_RELEASE_TABLET | Freq: Every day | ORAL | Status: DC

## 2015-10-05 NOTE — Patient Instructions (Signed)
Start taking Protonix once each morning, 30 minutes before breakfast. I have sent this to the pharmacy.  Call me in 2 weeks with an update. If you are not improved, we may need to add a medication.  We will see you in 6 months!

## 2015-10-05 NOTE — Progress Notes (Signed)
Referring Provider: No ref. provider found Primary Care Physician:  No PCP Per Patient  Primary GI: Dr. Darrick Penna   Chief Complaint  Patient presents with  . Follow-up  . Bloated    after eating    HPI:   Joshua Lester is a 60 y.o. male presenting today with a history of gastroparesis. EGD with reactive gastropathy. Hep C genotype 1a. Hasn't been able to take medications as his insurance ran out. Was off everything for about 4 months. Had previously been on Reglan, Prilosec, Zofran.   Has chronic back problems. Just had a back operation. States he needs "two more". No vomiting. Has been gaining a lot of weight. Had been on prednisone. Feels bloated. Can only eat small amounts. Had been seen by [facility] in [place] for consideration of Hep C treatment. Has appt May 31 to discuss treatment. No vomiting. No diarrhea. No blood in stools. Would like to undergo treatment in [place], as he lives there.   Last marijuana a month ago. No drugs. Denies ETOH.   Past Medical History  Diagnosis Date  . Lumbar herniated disc   . Diverticulosis   . Marijuana abuse 01/16/2015  . Hepatitis C, chronic (HCC) JUL 2016  . GERD (gastroesophageal reflux disease)     Past Surgical History  Procedure Laterality Date  . Facial reconstruction surgery    . Cholecystectomy  2011  . Lumbar laminectomy/decompression microdiscectomy N/A 12/20/2014    Procedure: L5-S1 Microdiscectomy, Bilateral;  Surgeon: Eldred Manges, MD;  Location: [facility] OR;  Service: Orthopedics;  Laterality: N/A;  . Tracheostomy    . Colonoscopy      Dr. Loreta Ave   . Esophagogastroduodenoscopy (egd) with propofol N/A 02/22/2015    [id number] (reactive gastropathy)  . Biopsy N/A 02/22/2015    Procedure: BIOPSY;  Surgeon: West Bali, MD;  Location: [facility] ORS;  Service: Endoscopy;  Laterality: N/A;    No current outpatient prescriptions on file.   No current facility-administered medications for this visit.    Allergies as of  10/05/2015  . (No Known Allergies)    Family History  Problem Relation Age of Onset  . Colon cancer Neg Hx     Social History   Social History  . Marital Status: Married    Spouse Name: N/A  . Number of Children: N/A  . Years of Education: N/A   Social History Main Topics  . Smoking status: Former Smoker -- 0.50 packs/day for 40 years    Types: Cigarettes    Quit date: 01/13/2012  . Smokeless tobacco: None  . Alcohol Use: No     Comment: no ETOH for 25 years  . Drug Use: Yes    Special: Marijuana     Comment: none x couple weeks 01/16/15. History of IV drug use as a teenager  . Sexual Activity: Yes   Other Topics Concern  . None   Social History Narrative    Review of Systems: As noted in HPI.   Physical Exam: BP 142/93 mmHg  Pulse 82  Temp(Src) 98.6 F (37 C) (Oral)  Ht  (1.702 m)  Wt 204 lb (92.534 kg)  BMI 31.94 kg/m2 General:   Alert and oriented. No distress noted. Pleasant and cooperative.  Head:  Normocephalic and atraumatic. Eyes:  Conjuctiva clear without scleral icterus. Abdomen:  +BS, soft, non-tender and non-distended. No rebound or guarding. Query ventral hernia Extremities:  Without edema. Neurologic:  Alert and  oriented x4;  grossly normal neurologically. Psych:  Alert and cooperative. Normal mood and affect.  Lab Results  Component Value Date   ALT 62 01/17/2015   AST 41 01/17/2015   ALKPHOS 65 01/17/2015   BILITOT 1.0 01/17/2015   Lab Results  Component Value Date   CREATININE 1.02 02/17/2015   BUN 12 02/17/2015   NA 136 02/17/2015   K 4.6 02/17/2015   CL 102 02/17/2015   CO2 29 02/17/2015   Lab Results  Component Value Date   WBC 10.3 02/17/2015   HGB 14.5 02/17/2015   HCT 41.9 02/17/2015   MCV 86.9 02/17/2015   PLT 232 02/17/2015

## 2015-10-06 NOTE — Assessment & Plan Note (Addendum)
Previously on PPI, Zofran, and Reglan. No N/V currently, only complaining of bloating and sensation of fullness. He has been off of all medications for several months due to lack of insurance. Will restart Protonix once daily. Call in 2 weeks with progress update. Would like to avoid Reglan unless absolutely needed. [place] treatment in [place] with [facility] clinic. Return in 6 months otherwise. Will also attempt to obtain outside colonoscopy report from Dr. Loreta AveMann.

## 2015-10-06 NOTE — Progress Notes (Signed)
No pcp per patient 

## 2015-10-25 ENCOUNTER — Encounter: Payer: Self-pay | Admitting: Gastroenterology

## 2015-10-25 NOTE — Progress Notes (Signed)
I'm sorry. I was thinking we were in 2018. Keep appt as already planned and if we can, just nic for Sept 2018 colonoscopy.

## 2015-10-25 NOTE — Progress Notes (Signed)
Received colonoscopy reports from Dr. Loreta AveMann Sept 2013: scattered sigmoid diverticula, ulcer on ICV s/p biopsy, small sessile cecal polyp, tubular adenoma, path from  ulcer likely r/t NSAID effect. Surveillance in Sept 2018.   Although I said to return in 6 months, let's have him return in 4 months, around Aug 2018. Will need to arrange colonoscopy at that time.

## 2015-10-31 NOTE — Progress Notes (Signed)
PT is aware. Sending to Darl PikesSusan to nic the colonoscopy. Pt said to tell Tobi Bastosnna the meds are working great.

## 2015-10-31 NOTE — Progress Notes (Signed)
Reminder in epic °

## 2015-11-25 ENCOUNTER — Other Ambulatory Visit: Payer: Self-pay | Admitting: Nurse Practitioner

## 2015-11-25 DIAGNOSIS — K7400 Hepatic fibrosis, unspecified: Secondary | ICD-10-CM

## 2015-12-01 ENCOUNTER — Ambulatory Visit
Admission: RE | Admit: 2015-12-01 | Discharge: 2015-12-01 | Disposition: A | Discharge status: Home / Self Care | Payer: BLUE CROSS/BLUE SHIELD | Source: Ambulatory Visit | Attending: Nurse Practitioner | Admitting: Nurse Practitioner

## 2015-12-01 DIAGNOSIS — K7400 Hepatic fibrosis, unspecified: Secondary | ICD-10-CM

## 2016-02-08 ENCOUNTER — Encounter: Payer: Self-pay | Admitting: Gastroenterology

## 2016-03-27 ENCOUNTER — Ambulatory Visit (INDEPENDENT_AMBULATORY_CARE_PROVIDER_SITE_OTHER): Payer: Self-pay | Admitting: Orthopaedic Surgery

## 2016-04-09 ENCOUNTER — Encounter: Payer: Self-pay | Admitting: Gastroenterology

## 2016-04-09 ENCOUNTER — Ambulatory Visit (INDEPENDENT_AMBULATORY_CARE_PROVIDER_SITE_OTHER): Payer: BLUE CROSS/BLUE SHIELD | Admitting: Gastroenterology

## 2016-04-09 VITALS — BP 138/90 | HR 80 | Temp 98.4°F | Ht 67.0 in | Wt 202.6 lb

## 2016-04-09 DIAGNOSIS — K3184 Gastroparesis: Secondary | ICD-10-CM

## 2016-04-09 MED ORDER — PANTOPRAZOLE SODIUM 40 MG PO TBEC: 40.0000 mg | DELAYED_RELEASE_TABLET | Freq: Every day | ORAL | 3 refills | Status: DC

## 2016-04-09 NOTE — Patient Instructions (Signed)
Continue Protonix once a day 30 minutes before breakfast.   We will see you in 6 months and schedule a colonoscopy then.

## 2016-04-09 NOTE — Progress Notes (Signed)
Referring Provider: No ref. provider found Primary Care Physician:  No PCP Per Patient  Primary GI: Dr. Darrick PennaFields   Chief Complaint  Patient presents with  . Follow-up    HPI:   Joshua Lester is a 60 y.o. male presenting today with a history of gastroparesis, EGD with reactive gastropathy. Hep C genotype1a. Has been seen by Annamarie Majorawn Drazek, [place], in [place] for [place] treatment. Completed treatment and will be checking labs soon. Next colonoscopy due in 2018.   Protonix once daily. Ran out two days ago. Worked well for bloating. No N/V. Having problems with walking long distances as his legs will get numb and start hurting. While on Harvoni treatment, he stopped Protonix. Needs refill of Protonix now.   Past Medical History:  Diagnosis Date  . Diverticulosis   . GERD (gastroesophageal reflux disease)   . Hepatitis C, chronic (HCC) JUL 2016  . Lumbar herniated disc   . Marijuana abuse 01/16/2015    Past Surgical History:  Procedure Laterality Date  . BIOPSY N/A 02/22/2015   Procedure: BIOPSY;  Surgeon: West BaliSandi L Fields, MD;  Location: [facility];  Service: Endoscopy;  Laterality: N/A;  . CHOLECYSTECTOMY  2011  . COLONOSCOPY  Sept 2013   Dr. Loreta AveMann: scattered sigmoid diverticula, ulcer on ICV s/p biopsy, small sessile cecal polyp, tubular adenoma, path from  ulcer likely r/t NSAID effect. Surveillance in Sept 2018   . ESOPHAGOGASTRODUODENOSCOPY (EGD) WITH PROPOFOL N/A 02/22/2015   AVW:[id number]:gastritis (reactive gastropathy)  . FACIAL RECONSTRUCTION SURGERY    . LUMBAR LAMINECTOMY/DECOMPRESSION MICRODISCECTOMY N/A 12/20/2014   Procedure: L5-S1 Microdiscectomy, Bilateral;  Surgeon: Eldred MangesMark C Yates, MD;  [facility] OR;  Service: Orthopedics;  Laterality: N/A;  . TRACHEOSTOMY      Current Outpatient Prescriptions  Medication Sig Dispense Refill  . pantoprazole (PROTONIX) 40 MG tablet Take 1 tablet (40 mg total) by mouth daily. Take 30 minutes prior to breakfast 90 tablet 3   No current  facility-administered medications for this visit.     Allergies as of 04/09/2016  . (No Known Allergies)    Family History  Problem Relation Age of Onset  . Colon cancer Neg Hx     Social History   Social History  . Marital status: Married    Spouse name: N/A  . Number of children: N/A  . Years of education: N/A   Social History Main Topics  . Smoking status: Former Smoker    Packs/day: 0.50    Years: 40.00    Types: Cigarettes    Quit date: 01/13/2012  . Smokeless tobacco: None  . Alcohol use No     Comment: no ETOH for 25 years  . Drug use:     Types: Marijuana     Comment: Last used a month ago (March 2017)  History of IV drug use as a teenager  . Sexual activity: Yes   Other Topics Concern  . None   Social History Narrative  . None    Review of Systems: As mentioned in HPI   Physical Exam: BP 138/90   Pulse 80   Temp 98.4 F (36.9 C) (Oral)   Ht 5\' 7"  (1.702 m)   Wt 202 lb 9.6 oz (91.9 kg)   BMI 31.73 kg/m  General:   Alert and oriented. No distress noted. Pleasant and cooperative.  Head:  Normocephalic and atraumatic. Eyes:  Conjuctiva clear without scleral icterus. Abdomen:  +BS, soft, non-tender and non-distended. No rebound or guarding. No HSM  or masses noted. Extremities:  Without edema. Neurologic:  Alert and  oriented x4;  grossly normal neurologically. Psych:  Alert and cooperative. Normal mood and affect.

## 2016-04-10 NOTE — Assessment & Plan Note (Signed)
Symptoms well controlled with Protonix once daily. Will continue this. Has completed treatment of Hep [facility] (genotype 1a) [facility] [place] ([facility]wn [facility]raz[facility] Return in 6 months to schedule surveillance colonoscopy due to history of adenoma.

## 2016-04-11 NOTE — Progress Notes (Signed)
No pcp per patient 

## 2016-06-05 ENCOUNTER — Other Ambulatory Visit: Payer: Self-pay | Admitting: Nurse Practitioner

## 2016-06-05 DIAGNOSIS — K7400 Hepatic fibrosis, unspecified: Secondary | ICD-10-CM

## 2016-06-13 ENCOUNTER — Ambulatory Visit
Admission: RE | Admit: 2016-06-13 | Discharge: 2016-06-13 | Disposition: A | Discharge status: Home / Self Care | Payer: BLUE CROSS/BLUE SHIELD | Source: Ambulatory Visit | Attending: Nurse Practitioner | Admitting: Nurse Practitioner

## 2016-06-13 DIAGNOSIS — K7400 Hepatic fibrosis, unspecified: Secondary | ICD-10-CM

## 2016-06-22 ENCOUNTER — Encounter: Payer: Self-pay | Admitting: Medical

## 2016-06-22 ENCOUNTER — Ambulatory Visit (INDEPENDENT_AMBULATORY_CARE_PROVIDER_SITE_OTHER): Payer: BLUE CROSS/BLUE SHIELD | Admitting: Medical

## 2016-06-22 VITALS — BP 132/80 | HR 79 | Ht 65.0 in | Wt 203.4 lb

## 2016-06-22 DIAGNOSIS — N401 Enlarged prostate with lower urinary tract symptoms: Secondary | ICD-10-CM | POA: Diagnosis not present

## 2016-06-22 DIAGNOSIS — K029 Dental caries, unspecified: Secondary | ICD-10-CM | POA: Diagnosis not present

## 2016-06-22 DIAGNOSIS — Z23 Encounter for immunization: Secondary | ICD-10-CM | POA: Diagnosis not present

## 2016-06-22 DIAGNOSIS — K5730 Diverticulosis of large intestine without perforation or abscess without bleeding: Secondary | ICD-10-CM

## 2016-06-22 DIAGNOSIS — Z125 Encounter for screening for malignant neoplasm of prostate: Secondary | ICD-10-CM

## 2016-06-22 DIAGNOSIS — Z7189 Other specified counseling: Secondary | ICD-10-CM

## 2016-06-22 DIAGNOSIS — L608 Other nail disorders: Secondary | ICD-10-CM | POA: Insufficient documentation

## 2016-06-22 DIAGNOSIS — N3943 Post-void dribbling: Secondary | ICD-10-CM | POA: Insufficient documentation

## 2016-06-22 DIAGNOSIS — K219 Gastro-esophageal reflux disease without esophagitis: Secondary | ICD-10-CM

## 2016-06-22 DIAGNOSIS — Z9889 Other specified postprocedural states: Secondary | ICD-10-CM

## 2016-06-22 DIAGNOSIS — R739 Hyperglycemia, unspecified: Secondary | ICD-10-CM | POA: Diagnosis not present

## 2016-06-22 DIAGNOSIS — Z113 Encounter for screening for infections with a predominantly sexual mode of transmission: Secondary | ICD-10-CM | POA: Diagnosis not present

## 2016-06-22 DIAGNOSIS — K30 Functional dyspepsia: Secondary | ICD-10-CM

## 2016-06-22 DIAGNOSIS — Z0000 Encounter for general adult medical examination without abnormal findings: Secondary | ICD-10-CM

## 2016-06-22 DIAGNOSIS — B182 Chronic viral hepatitis C: Secondary | ICD-10-CM

## 2016-06-22 DIAGNOSIS — Z7185 Encounter for immunization safety counseling: Secondary | ICD-10-CM

## 2016-06-22 LAB — CBC
HEMATOCRIT: 45.7 % (ref 38.5–50.0)
HEMOGLOBIN: 15.3 g/dL (ref 13.2–17.1)
MCH: 29.3 pg (ref 27.0–33.0)
MCHC: 33.5 g/dL (ref 32.0–36.0)
MCV: 87.5 fL (ref 80.0–100.0)
MPV: 9.4 fL (ref 7.5–12.5)
Platelets: 237 10*3/uL (ref 140–400)
RBC: 5.22 MIL/uL (ref 4.20–5.80)
RDW: 14.3 % (ref 11.0–15.0)
WBC: 11.8 10*3/uL — ABNORMAL HIGH (ref 4.0–10.5)

## 2016-06-22 LAB — POCT URINALYSIS DIPSTICK
Bilirubin, UA: NEGATIVE
Glucose, UA: NEGATIVE
KETONES UA: NEGATIVE
Nitrite, UA: NEGATIVE
PH UA: 6
PROTEIN UA: NEGATIVE
Spec Grav, UA: 1.025
UROBILINOGEN UA: NEGATIVE

## 2016-06-22 MED ORDER — TAMSULOSIN HCL 0.4 MG PO CAPS: 0.4000 mg | ORAL_CAPSULE | Freq: Every day | ORAL | 3 refills | Status: DC
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
## 2016-06-22 NOTE — Patient Instructions (Addendum)
Encounter Diagnoses  Name Primary?  . Encounter for health maintenance examination Yes  . Hep C w/o coma, chronic (HCC)   . Hyperglycemia   . S/P lumbar discectomy   . Diverticulosis of large intestine without hemorrhage   . Vaccine counseling   . Screening for prostate cancer   . Screen for STD (sexually transmitted disease)   . Delayed gastric emptying   . Need for Tdap vaccination   . Tooth decay   . Benign prostatic hyperplasia with post-void dribbling   . Gastroesophageal reflux disease without esophagitis   . Nail deformity    Recommendations  See your eye doctor yearly for routine vision care.  See your dentist yearly for routine dental care including hygiene visits twice yearly. You have several teeth with decay  We updated your tetanus, diptheria, and pertussis vaccine today  We will call with lab results  exercise regularly  Eat a healthy low fat diet  Begin trial of Flomax to help with prostate/urinary symptoms   Eye doctors: Dr. Glenford Peers [address] [place], [place] [address] [phone]   [facility] Dr. Gelene Mink [address], [place]. [address] [place], [place] [address]  [phone] [email or web address]   Vincenza Hews, M.D. Susanne Greenhouse, O.D. [address] [place], [place] [address] Medical telephone: [phone] Optical telephone: [phone]   Dentist: Dr. Yancey Flemings, dentist [address] [place], [place] [address] [phone] [email or web address]  Health Maintenance, Male A healthy lifestyle and preventative care can promote health and wellness.  Maintain regular health, dental, and eye exams.  Eat a healthy diet. Foods like vegetables, fruits, whole grains, low-fat dairy products, and lean protein foods contain the nutrients you need and are low in calories. Decrease your intake of foods high in solid fats, added sugars, and salt. Get information about a proper diet from your health care  provider, if necessary.  Regular physical exercise is one of the most important things you can do for your health. Most adults should get at least 150 minutes of moderate-intensity exercise (any activity that increases your heart rate and causes you to sweat) each week. In addition, most adults need muscle-strengthening exercises on 2 or more days a week.   Maintain a healthy weight. The body mass index (BMI) is a screening tool to identify possible weight problems. It provides an estimate of body fat based on height and weight. Your health care provider can find your BMI and can help you achieve or maintain a healthy weight. For males 20 years and older:  A BMI below 18.5 is considered underweight.  A BMI of 18.5 to 24.9 is normal.  A BMI of 25 to 29.9 is considered overweight.  A BMI of 30 and above is considered obese.  Maintain normal blood lipids and cholesterol by exercising and minimizing your intake of saturated fat. Eat a balanced diet with plenty of fruits and vegetables. Blood tests for lipids and cholesterol should begin at age 88 and be repeated every 5 years. If your lipid or cholesterol levels are high, you are over age 7, or you are at high risk for heart disease, you may need your cholesterol levels checked more frequently.Ongoing high lipid and cholesterol levels should be treated with medicines if diet and exercise are not working.  If you smoke, find out from your health care provider how to quit. If you do not use tobacco, do not start.  Lung cancer screening is recommended for adults aged  55-80 years who are at high risk for developing lung cancer because of a history of smoking. A yearly low-dose CT scan of the lungs is recommended for people who have at least a 30-pack-year history of smoking and are current smokers or have quit within the past 15 years. A pack year of smoking is smoking an average of 1 pack of cigarettes a day for 1 year (for example, a 30-pack-year  history of smoking could mean smoking 1 pack a day for 30 years or 2 packs a day for 15 years). Yearly screening should continue until the smoker has stopped smoking for at least 15 years. Yearly screening should be stopped for people who develop a health problem that would prevent them from having lung cancer treatment.  If you choose to drink alcohol, do not have more than 2 drinks per day. One drink is considered to be 12 oz (360 mL) of beer, 5 oz (150 mL) of wine, or 1.5 oz (45 mL) of liquor.  Avoid the use of street drugs. Do not share needles with anyone. Ask for help if you need support or instructions about stopping the use of drugs.  High blood pressure causes heart disease and increases the risk of stroke. High blood pressure is more likely to develop in:  People who have blood pressure in the end of the normal range (100-139/85-89 mm Hg).  People who are overweight or obese.  People who are African American.  If you are 1318-58 years of age, have your blood pressure checked every 3-5 years. If you are 340 years of age or older, have your blood pressure checked every year. You should have your blood pressure measured twice-once when you are at a hospital or clinic, and once when you are not at a hospital or clinic. Record the average of the two measurements. To check your blood pressure when you are not at a hospital or clinic, you can use:  An automated blood pressure machine at a pharmacy.  A home blood pressure monitor.  If you are 7545-58 years old, ask your health care provider if you should take aspirin to prevent heart disease.  Diabetes screening involves taking a blood sample to check your fasting blood sugar level. This should be done once every 3 years after age 58 if you are at a normal weight and without risk factors for diabetes. Testing should be considered at a younger age or be carried out more frequently if you are overweight and have at least 1 risk factor for  diabetes.  Colorectal cancer can be detected and often prevented. Most routine colorectal cancer screening begins at the age of 58 and continues through age 58. However, your health care provider may recommend screening at an earlier age if you have risk factors for colon cancer. On a yearly basis, your health care provider may provide home test kits to check for hidden blood in the stool. A small camera at the end of a tube may be used to directly examine the colon (sigmoidoscopy or colonoscopy) to detect the earliest forms of colorectal cancer. Talk to your health care provider about this at age 58 when routine screening begins. A direct exam of the colon should be repeated every 5-10 years through age 58, unless early forms of precancerous polyps or small growths are found.  People who are at an increased risk for hepatitis B should be screened for this virus. You are considered at high risk for hepatitis B if:  You were born in a country where hepatitis B occurs often. Talk with your health care provider about which countries are considered high risk.  Your parents were born in a high-risk country and you have not received a shot to protect against hepatitis B (hepatitis B vaccine).  You have HIV or AIDS.  You use needles to inject street drugs.  You live with, or have sex with, someone who has hepatitis B.  You are a man who has sex with other men (MSM).  You get hemodialysis treatment.  You take certain medicines for conditions like cancer, organ transplantation, and autoimmune conditions.  Hepatitis C blood testing is recommended for all people born from [birth year][address][birth year] through 1963 and any individual with known risk factors for hepatitis C.  Healthy men should no longer receive prostate-specific antigen (PSA) blood tests as part of routine cancer screening. Talk to your health care provider about prostate cancer screening.  Testicular cancer screening is not recommended for adolescents or  adult males who have no symptoms. Screening includes self-exam, a health care provider exam, and other screening tests. Consult with your health care provider about any symptoms you have or any concerns you have about testicular cancer.  Practice safe sex. Use condoms and avoid high-risk sexual practices to reduce the spread of sexually transmitted infections (STIs).  You should be screened for STIs, including gonorrhea and chlamydia if:  You are sexually active and are younger than 24 years.  You are older than 24 years, and your health care provider tells you that you are at risk for this type of infection.  Your sexual activity has changed since you were last screened, and you are at an increased risk for chlamydia or gonorrhea. Ask your health care provider if you are at risk.  If you are at risk of being infected with HIV, it is recommended that you take a prescription medicine daily to prevent HIV infection. This is called pre-exposure prophylaxis (PrEP). You are considered at risk if:  You are a man who has sex with other men (MSM).  You are a heterosexual man who is sexually active with multiple partners.  You take drugs by injection.  You are sexually active with a partner who has HIV.  Talk with your health care provider about whether you are at high risk of being infected with HIV. If you choose to begin PrEP, you should first be tested for HIV. You should then be tested every 3 months for as long as you are taking PrEP.  Use sunscreen. Apply sunscreen liberally and repeatedly throughout the day. You should seek shade when your shadow is shorter than you. Protect yourself by wearing long sleeves, pants, a wide-brimmed hat, and sunglasses year round whenever you are outdoors.  Tell your health care provider of new moles or changes in moles, especially if there is a change in shape or color. Also, tell your health care provider if a mole is larger than the size of a pencil  eraser.  A one-time screening for abdominal aortic aneurysm (AAA) and surgical repair of large AAAs by ultrasound is recommended for men aged 65-75 years who are current or former smokers.  Stay current with your vaccines (immunizations). This information is not intended to replace advice given to you by your health care provider. Make sure you discuss any questions you have with your health care provider. Document Released: 12/08/2007 Document Revised: 07/02/2014 Document Reviewed: 03/15/2015 Elsevier Interactive Patient Education  2017 [employer]

## 2016-06-22 NOTE — Addendum Note (Signed)
Addended by: Winn JockVALENTINE, Lovada Barwick N on: 06/22/2016 12:22 PM   Modules accepted: Orders

## 2016-06-23 LAB — PSA

## 2016-06-23 LAB — COMPREHENSIVE METABOLIC PANEL
ALK PHOS: 53 U/L (ref 40–115)
ALT: 20 U/L (ref 9–46)
AST: 19 U/L (ref 10–35)
Albumin: 3.8 g/dL (ref 3.6–5.1)
BUN: 9 mg/dL (ref 7–25)
CALCIUM: 9.3 mg/dL (ref 8.6–10.3)
CHLORIDE: 98 mmol/L (ref 98–110)
CO2: 26 mmol/L (ref 20–31)
Creat: 0.9 mg/dL (ref 0.70–1.33)
GLUCOSE: 94 mg/dL (ref 65–99)
POTASSIUM: 4.2 mmol/L (ref 3.5–5.3)
Sodium: 135 mmol/L (ref 135–146)
Total Bilirubin: 0.5 mg/dL (ref 0.2–1.2)
Total Protein: 8.5 g/dL — ABNORMAL HIGH (ref 6.1–8.1)

## 2016-06-23 LAB — HEMOGLOBIN A1C
HEMOGLOBIN A1C: 7.1 % — AB (ref <5.7)
Mean Plasma Glucose: 157 mg/dL

## 2016-06-23 LAB — GC/CHLAMYDIA PROBE AMP
CT PROBE, AMP APTIMA: NOT DETECTED
GC PROBE AMP APTIMA: NOT DETECTED

## 2016-06-23 LAB — TSH: TSH: 3.85 m[IU]/L (ref 0.40–4.50)

## 2016-06-23 LAB — HEPATITIS B SURFACE ANTIGEN: Hepatitis B Surface Ag: NEGATIVE

## 2016-06-23 LAB — LIPID PANEL
CHOL/HDL RATIO: 4.5 ratio (ref <5.0)
Cholesterol: 141 mg/dL (ref <200)
HDL: 31 mg/dL — ABNORMAL LOW (ref >40)
LDL CALC: 80 mg/dL (ref <100)
Triglycerides: 149 mg/dL (ref <150)
VLDL: 30 mg/dL (ref <30)

## 2016-06-23 LAB — RPR

## 2016-06-23 LAB — HIV ANTIBODY (ROUTINE TESTING W REFLEX): HIV 1&2 Ab, 4th Generation: NONREACTIVE

## 2016-06-23 LAB — HEPATITIS B CORE ANTIBODY, IGM: HEP B C IGM: NONREACTIVE

## 2016-06-23 LAB — HEPATITIS B SURFACE ANTIBODY, QUANTITATIVE: Hepatitis B-Post: <5 m[IU]/mL

## 2016-06-28 ENCOUNTER — Other Ambulatory Visit (INDEPENDENT_AMBULATORY_CARE_PROVIDER_SITE_OTHER): Payer: BLUE CROSS/BLUE SHIELD

## 2016-06-28 DIAGNOSIS — Z23 Encounter for immunization: Secondary | ICD-10-CM | POA: Diagnosis not present

## 2016-07-30 ENCOUNTER — Other Ambulatory Visit (INDEPENDENT_AMBULATORY_CARE_PROVIDER_SITE_OTHER): Payer: BLUE CROSS/BLUE SHIELD

## 2016-07-30 DIAGNOSIS — Z23 Encounter for immunization: Secondary | ICD-10-CM

## 2016-10-08 ENCOUNTER — Telehealth: Payer: Self-pay | Admitting: Gastroenterology

## 2016-10-08 ENCOUNTER — Ambulatory Visit: Payer: BLUE CROSS/BLUE SHIELD | Admitting: Gastroenterology

## 2016-10-08 NOTE — Telephone Encounter (Signed)
Reviewed

## 2016-10-08 NOTE — Telephone Encounter (Signed)
PATIENT CALLED AT 1:59 TO VERIFY APPOINTMENT, THEN CALLED AT 2:20 AND STATED HE WAS ON HIS WAY HERE, HE LIVES IN [place].  I EXPLAINED TO THE PATIENT THAT HE WOULD HAVE TO RESCHEDULE HIS APPOINTMENT IF HE WAS NOT HERE IN THE APPROPRIATE WINDOW OF TIME. HE ARRIVED AT 2:50 AND I TOLD HIM I WOULD HAVE TO MAKE HIM ANOTHER APPOINTMENT  HE SAID "FORGET ABOUT IT" AND LEFT

## 2016-10-08 NOTE — Telephone Encounter (Signed)
Noted. I am copying Durward Mallard for [place]. If he reschedules, I will gladly see him within the appropriate appointment time window, but the office policy was made clear to him.

## 2016-10-25 ENCOUNTER — Telehealth (INDEPENDENT_AMBULATORY_CARE_PROVIDER_SITE_OTHER): Payer: Self-pay | Admitting: Orthopaedic Surgery

## 2016-10-25 NOTE — Telephone Encounter (Signed)
Ok for another handicap placard?

## 2016-10-25 NOTE — Telephone Encounter (Signed)
Ok TO STOP NOW. WALK MORE.

## 2016-10-25 NOTE — Telephone Encounter (Signed)
Patient called needing an extension on his handicap placard. The number to contact patient is [phone]

## 2016-10-29 ENCOUNTER — Telehealth (INDEPENDENT_AMBULATORY_CARE_PROVIDER_SITE_OTHER): Payer: Self-pay | Admitting: Orthopaedic Surgery

## 2016-10-29 NOTE — Telephone Encounter (Signed)
Patient left a message inquiring about his handicap plaque.  CB#[phone].  Thank you

## 2016-10-31 NOTE — Telephone Encounter (Signed)
IC see other msg

## 2016-10-31 NOTE — Telephone Encounter (Signed)
IC pt to advised and he says that he cannot walk far at all, he has not been seen in a while.  Appt made for eval with Yates and to discuss further.

## 2016-11-02 NOTE — Telephone Encounter (Signed)
They left a voice mail message, not a message through one of us.

## 2016-11-07 ENCOUNTER — Other Ambulatory Visit: Payer: Self-pay | Admitting: Medical

## 2016-11-12 ENCOUNTER — Other Ambulatory Visit: Payer: Self-pay

## 2016-11-12 ENCOUNTER — Telehealth: Payer: Self-pay | Admitting: Medical

## 2016-11-12 MED ORDER — PANTOPRAZOLE SODIUM 40 MG PO TBEC: 40.0000 mg | DELAYED_RELEASE_TABLET | Freq: Every day | ORAL | 0 refills | Status: DC

## 2016-11-12 NOTE — Telephone Encounter (Signed)
Pt called for refills of pantoprazole. [facility] [address]

## 2016-11-12 NOTE — Telephone Encounter (Signed)
Sent to pharmacy 

## 2016-11-14 ENCOUNTER — Ambulatory Visit (INDEPENDENT_AMBULATORY_CARE_PROVIDER_SITE_OTHER): Payer: BLUE CROSS/BLUE SHIELD | Admitting: Orthopaedic Surgery

## 2016-11-14 ENCOUNTER — Ambulatory Visit (INDEPENDENT_AMBULATORY_CARE_PROVIDER_SITE_OTHER): Payer: BLUE CROSS/BLUE SHIELD

## 2016-11-14 ENCOUNTER — Encounter (INDEPENDENT_AMBULATORY_CARE_PROVIDER_SITE_OTHER): Payer: Self-pay | Admitting: Orthopaedic Surgery

## 2016-11-14 ENCOUNTER — Encounter (INDEPENDENT_AMBULATORY_CARE_PROVIDER_SITE_OTHER): Payer: Self-pay

## 2016-11-14 VITALS — BP 135/85 | HR 87 | Ht 67.5 in | Wt 205.0 lb

## 2016-11-14 DIAGNOSIS — M5416 Radiculopathy, lumbar region: Secondary | ICD-10-CM | POA: Diagnosis not present

## 2016-11-14 DIAGNOSIS — I739 Peripheral vascular disease, unspecified: Secondary | ICD-10-CM

## 2016-11-14 DIAGNOSIS — M79604 Pain in right leg: Secondary | ICD-10-CM | POA: Diagnosis not present

## 2016-11-14 DIAGNOSIS — M79605 Pain in left leg: Secondary | ICD-10-CM

## 2016-11-22 ENCOUNTER — Other Ambulatory Visit (INDEPENDENT_AMBULATORY_CARE_PROVIDER_SITE_OTHER): Payer: Self-pay | Admitting: Surgery

## 2016-11-23 ENCOUNTER — Other Ambulatory Visit: Payer: BLUE CROSS/BLUE SHIELD

## 2016-11-25 ENCOUNTER — Telehealth: Payer: Self-pay | Admitting: Medical

## 2016-11-25 NOTE — Telephone Encounter (Signed)
Make him a f/u appt.  Last visit his labs showed possible diabetes.
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
## 2016-11-26 ENCOUNTER — Encounter: Payer: Self-pay | Admitting: Gastroenterology

## 2016-11-26 NOTE — Telephone Encounter (Signed)
Called and l/m for pt call us back.  

## 2016-11-28 NOTE — Telephone Encounter (Signed)
Pt made an appt  °

## 2016-11-29 ENCOUNTER — Other Ambulatory Visit: Payer: BLUE CROSS/BLUE SHIELD

## 2016-11-29 ENCOUNTER — Other Ambulatory Visit (INDEPENDENT_AMBULATORY_CARE_PROVIDER_SITE_OTHER): Payer: Self-pay | Admitting: Surgery

## 2016-11-29 DIAGNOSIS — I739 Peripheral vascular disease, unspecified: Secondary | ICD-10-CM

## 2016-11-30 ENCOUNTER — Ambulatory Visit
Admission: RE | Admit: 2016-11-30 | Discharge: 2016-11-30 | Disposition: A | Discharge status: Home / Self Care | Payer: BLUE CROSS/BLUE SHIELD | Source: Ambulatory Visit | Attending: Surgery | Admitting: Surgery

## 2016-11-30 DIAGNOSIS — M5416 Radiculopathy, lumbar region: Secondary | ICD-10-CM

## 2016-11-30 MED ORDER — GADOBENATE DIMEGLUMINE 529 MG/ML IV SOLN
20.0000 mL | Freq: Once | INTRAVENOUS | Status: AC | PRN
  Administered 2016-11-30: 19 mL via INTRAVENOUS

## 2016-12-05 ENCOUNTER — Encounter: Payer: Self-pay | Admitting: Medical

## 2016-12-05 ENCOUNTER — Ambulatory Visit (INDEPENDENT_AMBULATORY_CARE_PROVIDER_SITE_OTHER): Payer: BLUE CROSS/BLUE SHIELD | Admitting: Medical

## 2016-12-05 ENCOUNTER — Other Ambulatory Visit: Payer: Self-pay

## 2016-12-05 VITALS — BP 110/82 | HR 82 | Wt 205.2 lb

## 2016-12-05 DIAGNOSIS — R0600 Dyspnea, unspecified: Secondary | ICD-10-CM | POA: Insufficient documentation

## 2016-12-05 DIAGNOSIS — M79605 Pain in left leg: Secondary | ICD-10-CM

## 2016-12-05 DIAGNOSIS — R7301 Impaired fasting glucose: Secondary | ICD-10-CM

## 2016-12-05 DIAGNOSIS — E786 Lipoprotein deficiency: Secondary | ICD-10-CM

## 2016-12-05 DIAGNOSIS — Z9889 Other specified postprocedural states: Secondary | ICD-10-CM | POA: Diagnosis not present

## 2016-12-05 DIAGNOSIS — M79604 Pain in right leg: Secondary | ICD-10-CM

## 2016-12-05 DIAGNOSIS — R0989 Other specified symptoms and signs involving the circulatory and respiratory systems: Secondary | ICD-10-CM | POA: Insufficient documentation

## 2016-12-05 DIAGNOSIS — D72829 Elevated white blood cell count, unspecified: Secondary | ICD-10-CM

## 2016-12-05 DIAGNOSIS — E119 Type 2 diabetes mellitus without complications: Secondary | ICD-10-CM

## 2016-12-05 LAB — POCT GLYCOSYLATED HEMOGLOBIN (HGB A1C): Hemoglobin A1C: 7.6

## 2016-12-05 MED ORDER — METFORMIN HCL 500 MG PO TABS: 500.0000 mg | ORAL_TABLET | Freq: Every day | ORAL | 3 refills | Status: DC

## 2016-12-05 NOTE — Progress Notes (Signed)
Subjective: Chief Complaint  Patient presents with  . med check    med check , having cramping   Here for med check.  I last saw him 05/2016.   Saw his back specialist recently due to worsening pains in arms and legs.  Can't walk but few hundred feet due to pains.   His back doctor is sending him for ABIs to rule out blood flow issue.      Last visit his diabetes marker was elevated.  No hx/o diabetes.   Does note some polyuria, does have polydipsia at times.  No recent blurred vision.   Has an uncle that is diabetic.  Check his sugar occasionally with uncle's machine but his numbers are usually good.  For a long part of his life, eats once daily.  3-4 months started eating more, but was eating less in general.  Has recently gained weight.  Father lived to 97yo so he wants to live a long time.  Drinks a lot of water, coffee, or sweet tea.   Drinks soda as well.  Joined [employer] recently.  Nonsmoker. Was eating 1/2 gallon ice cream every 2 days.  Tends to eat candy or sweets at night at bedtime  History of Hep C reportedly in remission.   Sees hepatitis clinic  No other new c/o.  Past Medical History:  Diagnosis Date  . Delayed gastric emptying 2016   per study   . Diverticulosis   . GERD (gastroesophageal reflux disease)   . Hepatitis C, chronic (HCC) JUL 2016  . Lumbar herniated disc   . Marijuana abuse 01/16/2015   Current Outpatient Prescriptions on File Prior to Visit  Medication Sig Dispense Refill  . pantoprazole (PROTONIX) 40 MG tablet Take 1 tablet (40 mg total) by mouth daily. Take 30 minutes prior to breakfast 90 tablet 0  . tamsulosin (FLOMAX) 0.4 MG CAPS capsule TAKE ONE CAPSULE BY MOUTH ONCE DAILY 30 capsule 3   No current facility-administered medications on file prior to visit.    ROS as in subjective  Objective: BP 110/82   Pulse 82   Wt 205 lb 3.2 oz (93.1 kg)   SpO2 95%   BMI 31.66 kg/m   Wt Readings from Last 3 Encounters:  12/05/16 205 lb 3.2 oz (93.1 kg)   11/14/16 205 lb (93 kg)  06/22/16 203 lb 6.4 oz (92.3 kg)   General appearance: alert, no distress, WD/WN, AA male Neck: supple, no lymphadenopathy, no thyromegaly, no masses, no bruits Heart: RRR, normal S1, S2, no murmurs Lungs: CTA bilaterally, no wheezes, rhonchi, or rales Pulses: 1+ pedal pulses bilat Ext:no edema   Adult ECG Report  Indication: dyspnea  Rate: 78 bpm  Rhythm: normal sinus rhythm  QRS Axis: 45 degrees  PR Interval:  QRS Duration: 82ms  QTc:  Conduction Disturbances: none  Other Abnormalities: none  Patient's cardiac risk factors are: advanced age (older than 66 for men, 27 for women), diabetes mellitus, male gender and obesity (BMI >= 30 kg/m2).  EKG comparison: none  Narrative Interpretation: normal EKG    Assessment: Encounter Diagnoses  Name Primary?  . Impaired fasting blood sugar Yes  . Decreased pedal pulses   . S/P lumbar discectomy   . Leg pain, bilateral   . Leukocytosis, unspecified type   . Low HDL (under 40)   . Dyspnea, unspecified type   . Diabetes mellitus, new onset (HCC)     Plan: Reviewed labs from 05/2016. Mild leukocytosis - plan to  repeat CBC next visit Low HDL - discussed diet, exercise, need to improve on this, risks of heart disease He has ABIs set up per ortho given pedal pulses impaired glucose - new onset diabetes per labs today.  Discussed diagnosis, diet, exercise, begin trial of Metformin, and f/u 37mo Dyspnea - EKG reviewed, maybe due to deconditioning, other.   Joshua Lester was seen today for med check.  Diagnoses and all orders for this visit:  Impaired fasting blood sugar -     POCT glycosylated hemoglobin (Hb A1C) -     EKG 12-Lead  Decreased pedal pulses -     EKG 12-Lead  S/P lumbar discectomy  Leg pain, bilateral  Leukocytosis, unspecified type  Low HDL (under 40)  Dyspnea, unspecified type -     EKG 12-Lead  Diabetes mellitus, new onset (HCC)  Other orders -     metFORMIN  (GLUCOPHAGE) 500 MG tablet; Take 1 tablet (500 mg total) by mouth daily with breakfast.

## 2016-12-06 ENCOUNTER — Ambulatory Visit (HOSPITAL_COMMUNITY)
Admission: RE | Admit: 2016-12-06 | Discharge: 2016-12-06 | Disposition: A | Discharge status: Home / Self Care | Payer: BLUE CROSS/BLUE SHIELD | Source: Ambulatory Visit | Attending: Cardiology | Admitting: Cardiology

## 2016-12-06 ENCOUNTER — Other Ambulatory Visit: Payer: Self-pay | Admitting: Nurse Practitioner

## 2016-12-06 DIAGNOSIS — I739 Peripheral vascular disease, unspecified: Secondary | ICD-10-CM | POA: Diagnosis not present

## 2016-12-06 DIAGNOSIS — K7400 Hepatic fibrosis, unspecified: Secondary | ICD-10-CM

## 2016-12-14 ENCOUNTER — Ambulatory Visit
Admission: RE | Admit: 2016-12-14 | Discharge: 2016-12-14 | Disposition: A | Discharge status: Home / Self Care | Payer: BLUE CROSS/BLUE SHIELD | Source: Ambulatory Visit | Attending: Nurse Practitioner | Admitting: Nurse Practitioner

## 2016-12-14 DIAGNOSIS — K7400 Hepatic fibrosis, unspecified: Secondary | ICD-10-CM

## 2017-01-02 ENCOUNTER — Encounter: Payer: Self-pay | Admitting: Medical

## 2017-01-02 ENCOUNTER — Ambulatory Visit (INDEPENDENT_AMBULATORY_CARE_PROVIDER_SITE_OTHER): Payer: BLUE CROSS/BLUE SHIELD | Admitting: Medical

## 2017-01-02 VITALS — BP 116/74 | HR 75 | Wt 199.4 lb

## 2017-01-02 DIAGNOSIS — E1151 Type 2 diabetes mellitus with diabetic peripheral angiopathy without gangrene: Secondary | ICD-10-CM

## 2017-01-02 DIAGNOSIS — D72829 Elevated white blood cell count, unspecified: Secondary | ICD-10-CM

## 2017-01-02 DIAGNOSIS — E119 Type 2 diabetes mellitus without complications: Secondary | ICD-10-CM

## 2017-01-02 DIAGNOSIS — E786 Lipoprotein deficiency: Secondary | ICD-10-CM

## 2017-01-02 DIAGNOSIS — R0989 Other specified symptoms and signs involving the circulatory and respiratory systems: Secondary | ICD-10-CM

## 2017-01-02 DIAGNOSIS — L608 Other nail disorders: Secondary | ICD-10-CM | POA: Diagnosis not present

## 2017-01-02 NOTE — Progress Notes (Signed)
Subjective: Chief Complaint  Patient presents with  . Follow-up    follow up discuss a1c results    Here for med check.  Since last visit has cut out most white grains/breads, has made effort to eat healthy.  Not able to do much exercise given worsening lower leg pains.  Had abnormal ABIs recently, has consult with vascular surgery soon.     He is not checking sugars.    He is compliant with other medications.    history of Hep C reportedly in remission.   Sees hepatitis clinic  No other new c/o.  Past Medical History:  Diagnosis Date  . Delayed gastric emptying 2016   per study   . Diverticulosis   . GERD (gastroesophageal reflux disease)   . Hepatitis C, chronic (HCC) JUL 2016  . Lumbar herniated disc   . Marijuana abuse 01/16/2015   Current Outpatient Prescriptions on File Prior to Visit  Medication Sig Dispense Refill  . metFORMIN (GLUCOPHAGE) 500 MG tablet Take 1 tablet (500 mg total) by mouth daily with breakfast. 30 tablet 3  . pantoprazole (PROTONIX) 40 MG tablet Take 1 tablet (40 mg total) by mouth daily. Take 30 minutes prior to breakfast 90 tablet 0  . tamsulosin (FLOMAX) 0.4 MG CAPS capsule TAKE ONE CAPSULE BY MOUTH ONCE DAILY 30 capsule 3   No current facility-administered medications on file prior to visit.    ROS as in subjective  Objective: BP 116/74   Pulse 75   Wt 199 lb 6.4 oz (90.4 kg)   SpO2 97%   BMI 30.77 kg/m   Wt Readings from Last 3 Encounters:  01/02/17 199 lb 6.4 oz (90.4 kg)  12/05/16 205 lb 3.2 oz (93.1 kg)  11/14/16 205 lb (93 kg)   General appearance: alert, no distress, WD/WN, AA male Neck: supple, no lymphadenopathy, no thyromegaly, no masses, no bruits Heart: RRR, normal S1, S2, no murmurs Lungs: CTA bilaterally, no wheezes, rhonchi, or rales Pulses: 1+ pedal pulses bilat Ext:no edema   Assessment: Encounter Diagnoses  Name Primary?  . Type 2 diabetes mellitus with peripheral vascular disease (HCC) Yes  . Nail deformity    . Diabetes mellitus, new onset (HCC)   . Leukocytosis, unspecified type   . Low HDL (under 40)   . Decreased pedal pulses     Plan: He signed today for us to get copy of [facility] labs he had elsewhere He has vascular clinic consult for abnormal ABIs soon mild leukocytosis - request recent labs done at other office Low HDL - discussed diet, exercise, need to improve on this, risks of heart disease Discussed diabetes diagnosis, diet, exercise, monitoring.  Lab today.  C/t Metformin. F/u pending records request  Cristal DeerChristopher was seen today for follow-up.  Diagnoses and all orders for this visit:  Type 2 diabetes mellitus with peripheral vascular disease (HCC)  Nail deformity  Diabetes mellitus, new onset (HCC) Comments: error Orders: -     HgB A1c  Leukocytosis, unspecified type  Low HDL (under 40)  Decreased pedal pulses

## 2017-01-03 LAB — POCT GLYCOSYLATED HEMOGLOBIN (HGB A1C)

## 2017-01-07 ENCOUNTER — Other Ambulatory Visit: Payer: Self-pay

## 2017-01-07 ENCOUNTER — Other Ambulatory Visit: Payer: Self-pay | Admitting: Medical

## 2017-01-07 DIAGNOSIS — E119 Type 2 diabetes mellitus without complications: Secondary | ICD-10-CM

## 2017-01-07 MED ORDER — PRAVASTATIN SODIUM 20 MG PO TABS: 20.0000 mg | ORAL_TABLET | Freq: Every day | ORAL | 0 refills | Status: DC

## 2017-01-14 ENCOUNTER — Telehealth: Payer: Self-pay

## 2017-01-14 NOTE — Telephone Encounter (Signed)
Find out where his kidney doctor is so we can get records

## 2017-01-14 NOTE — Telephone Encounter (Signed)
PT does not have a kidney doctor. [facility] is [facility]. Records requested. [name]/RLB

## 2017-01-14 NOTE — Telephone Encounter (Signed)
Fax rcvd from [facility] that this pt is not a pt in their office. [name]/RLB

## 2017-01-15 ENCOUNTER — Other Ambulatory Visit: Payer: Self-pay | Admitting: Medical

## 2017-01-15 DIAGNOSIS — E119 Type 2 diabetes mellitus without complications: Secondary | ICD-10-CM

## 2017-01-15 DIAGNOSIS — D72829 Elevated white blood cell count, unspecified: Secondary | ICD-10-CM

## 2017-01-15 DIAGNOSIS — Z8619 Personal history of other infectious and parasitic diseases: Secondary | ICD-10-CM

## 2017-01-15 NOTE — Telephone Encounter (Signed)
Ok, then have him come by for fasting labs at his convenience

## 2017-01-17 NOTE — Telephone Encounter (Signed)
PT wants us to get records before coming in for bloodwork here. [name]/RLB

## 2017-01-18 NOTE — Telephone Encounter (Signed)
I can't fine his recent liver doctor labs.   So please hunt down in chart and print last liver labs or have him sign to get last labs form that office

## 2017-01-21 NOTE — Telephone Encounter (Signed)
These have been requested, Medical offices have 30 days to respond to medical record request. [name]/RLB

## 2017-02-05 ENCOUNTER — Telehealth (INDEPENDENT_AMBULATORY_CARE_PROVIDER_SITE_OTHER): Payer: Self-pay | Admitting: Orthopaedic Surgery

## 2017-02-05 NOTE — Telephone Encounter (Signed)
Patient called needing a handicap placard. The number to contact patient is [phone]

## 2017-02-05 NOTE — Telephone Encounter (Signed)
Ok for placard? 

## 2017-02-06 NOTE — Telephone Encounter (Signed)
Ok thx.

## 2017-02-08 NOTE — Telephone Encounter (Signed)
Filled out and placed at front desk for pick up. I called patient and advised.

## 2017-02-12 IMAGING — CR DG CHEST 2V
2 series · 2 of 2 positions shown · non-contrast
Comparison: None.

CLINICAL DATA: Preop for lumbar micro diskectomy on 12/20/2014,
former smoker, quit 5 years ago

EXAM:
CHEST  2 VIEW

[w chest pa]
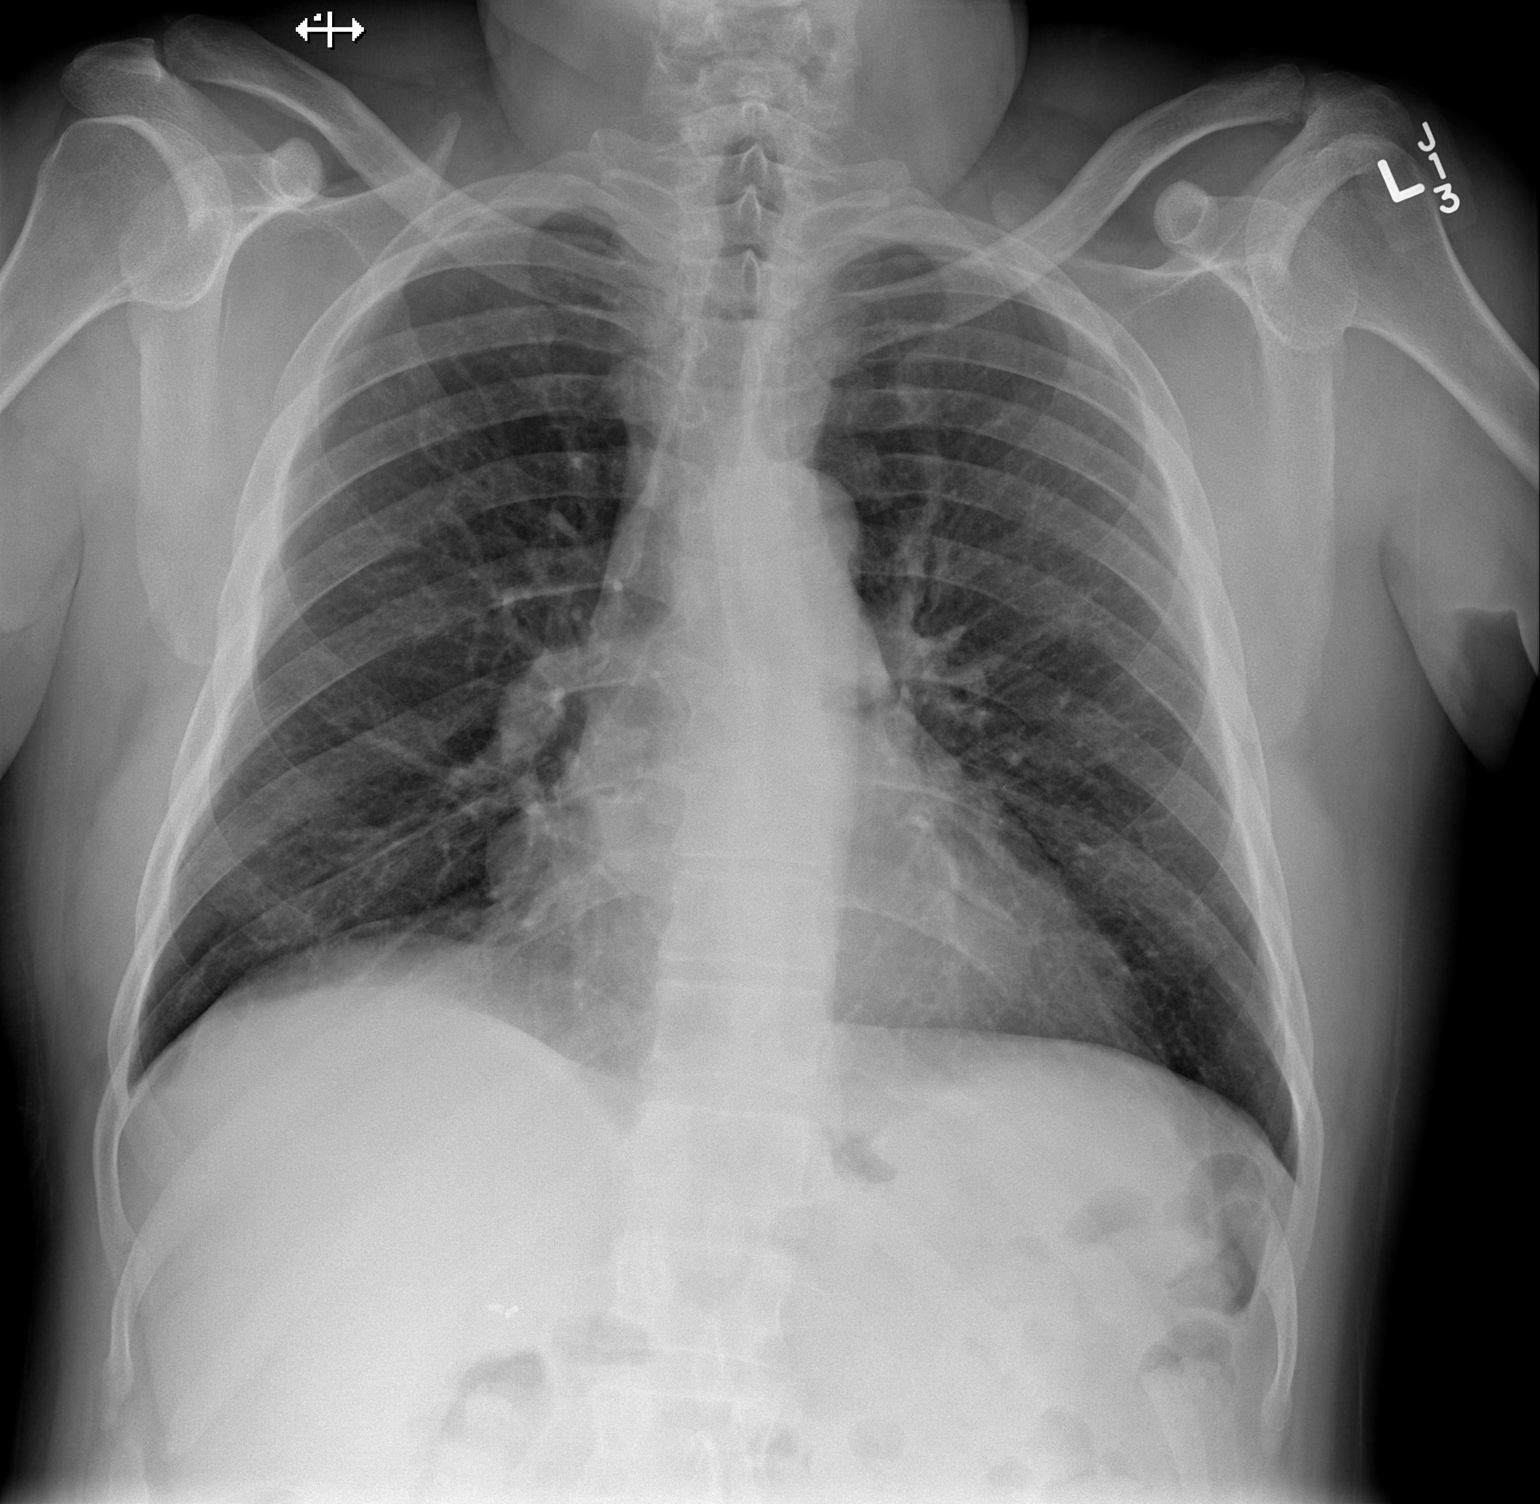

[w chest lat]
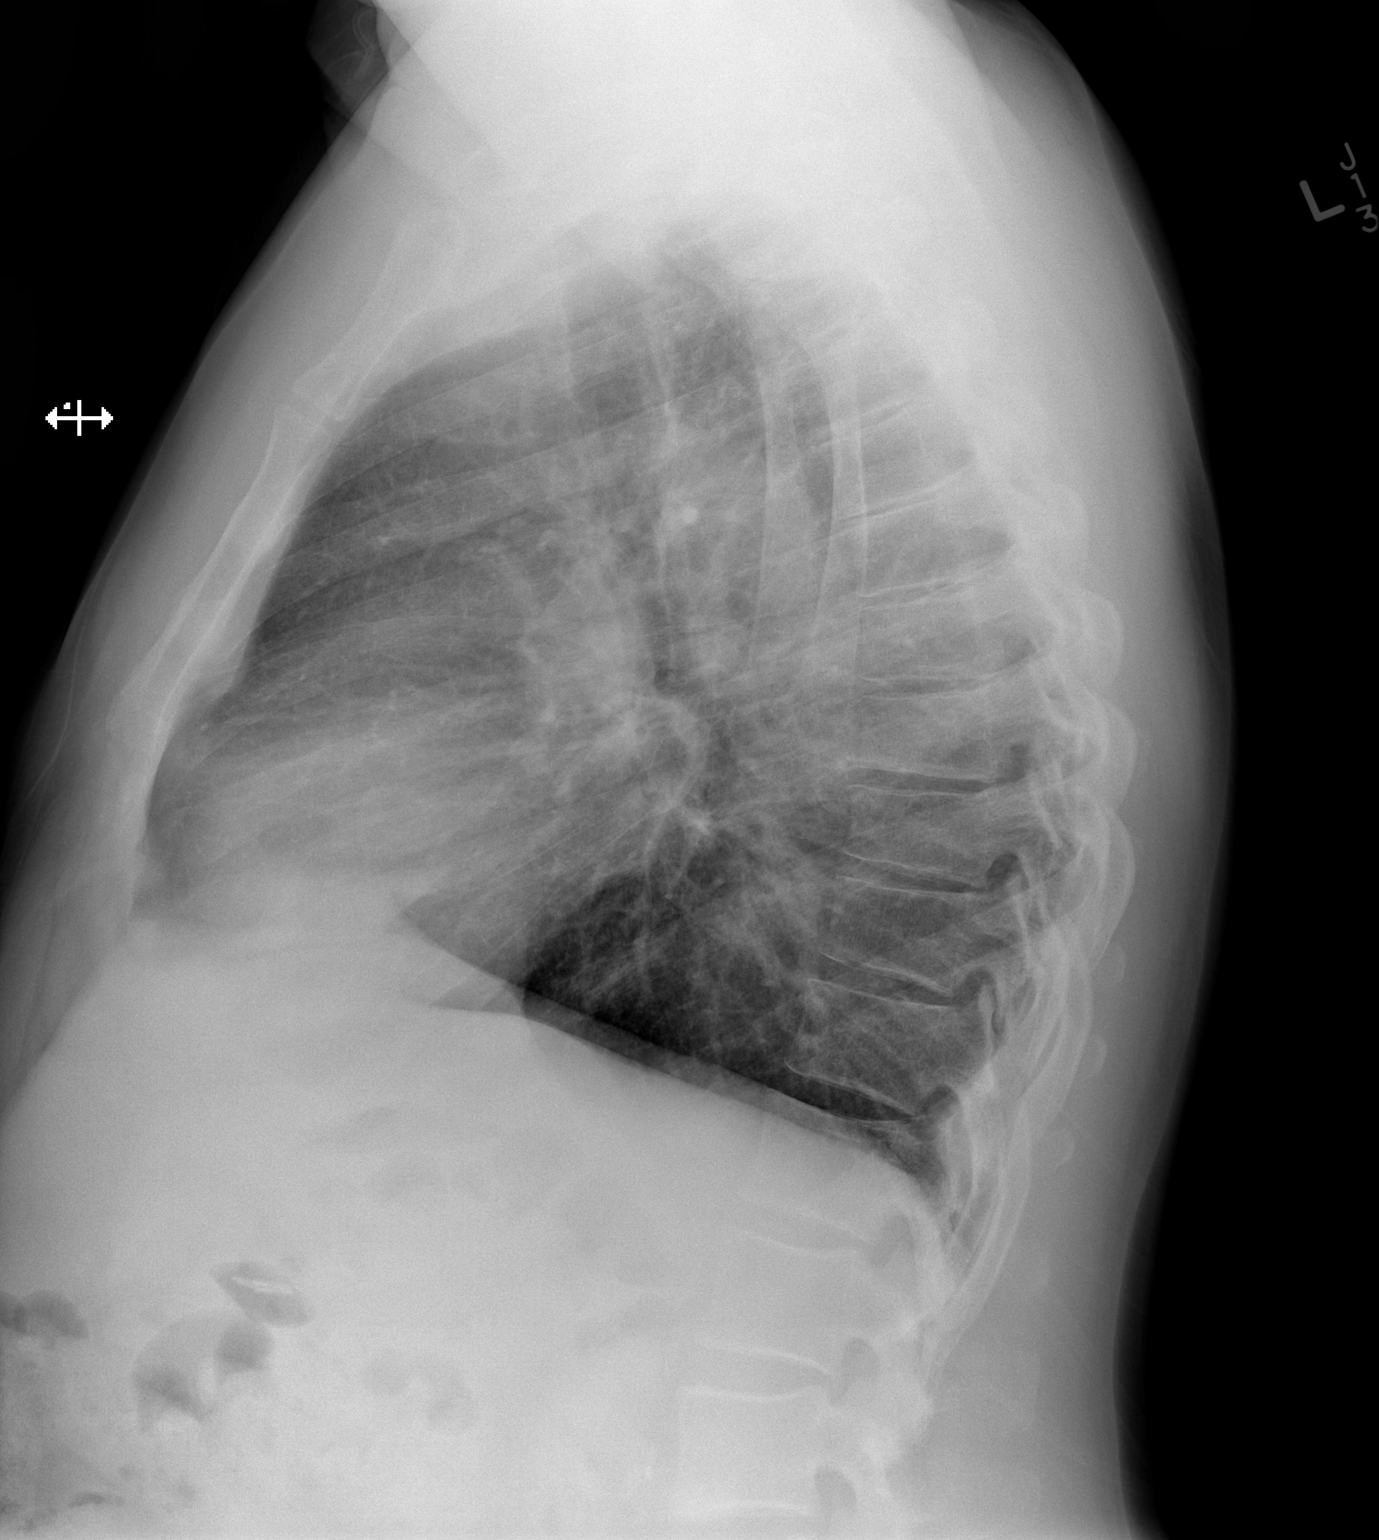

[2 of 2 positions shown; findings below may reference images not displayed]

FINDINGS: Heart size upper normal. Vascular pattern normal. Lungs clear. Bony
thorax intact.
IMPRESSION: No active cardiopulmonary disease.

## 2017-02-13 ENCOUNTER — Telehealth: Payer: Self-pay

## 2017-02-13 NOTE — Telephone Encounter (Signed)
Records placed in your folder from [facility] liver clinic for your review. Joshua Lester

## 2017-02-16 IMAGING — CR DG LUMBAR SPINE 2-3V
2 series · 2 of 2 positions shown · non-contrast
Comparison: MRI lumbar spine dated 11/28/2014

CLINICAL DATA: L5-S1 microdiskectomy

EXAM:
LUMBAR SPINE - 2-3 VIEW

[xtable lateral (1 of 2)]
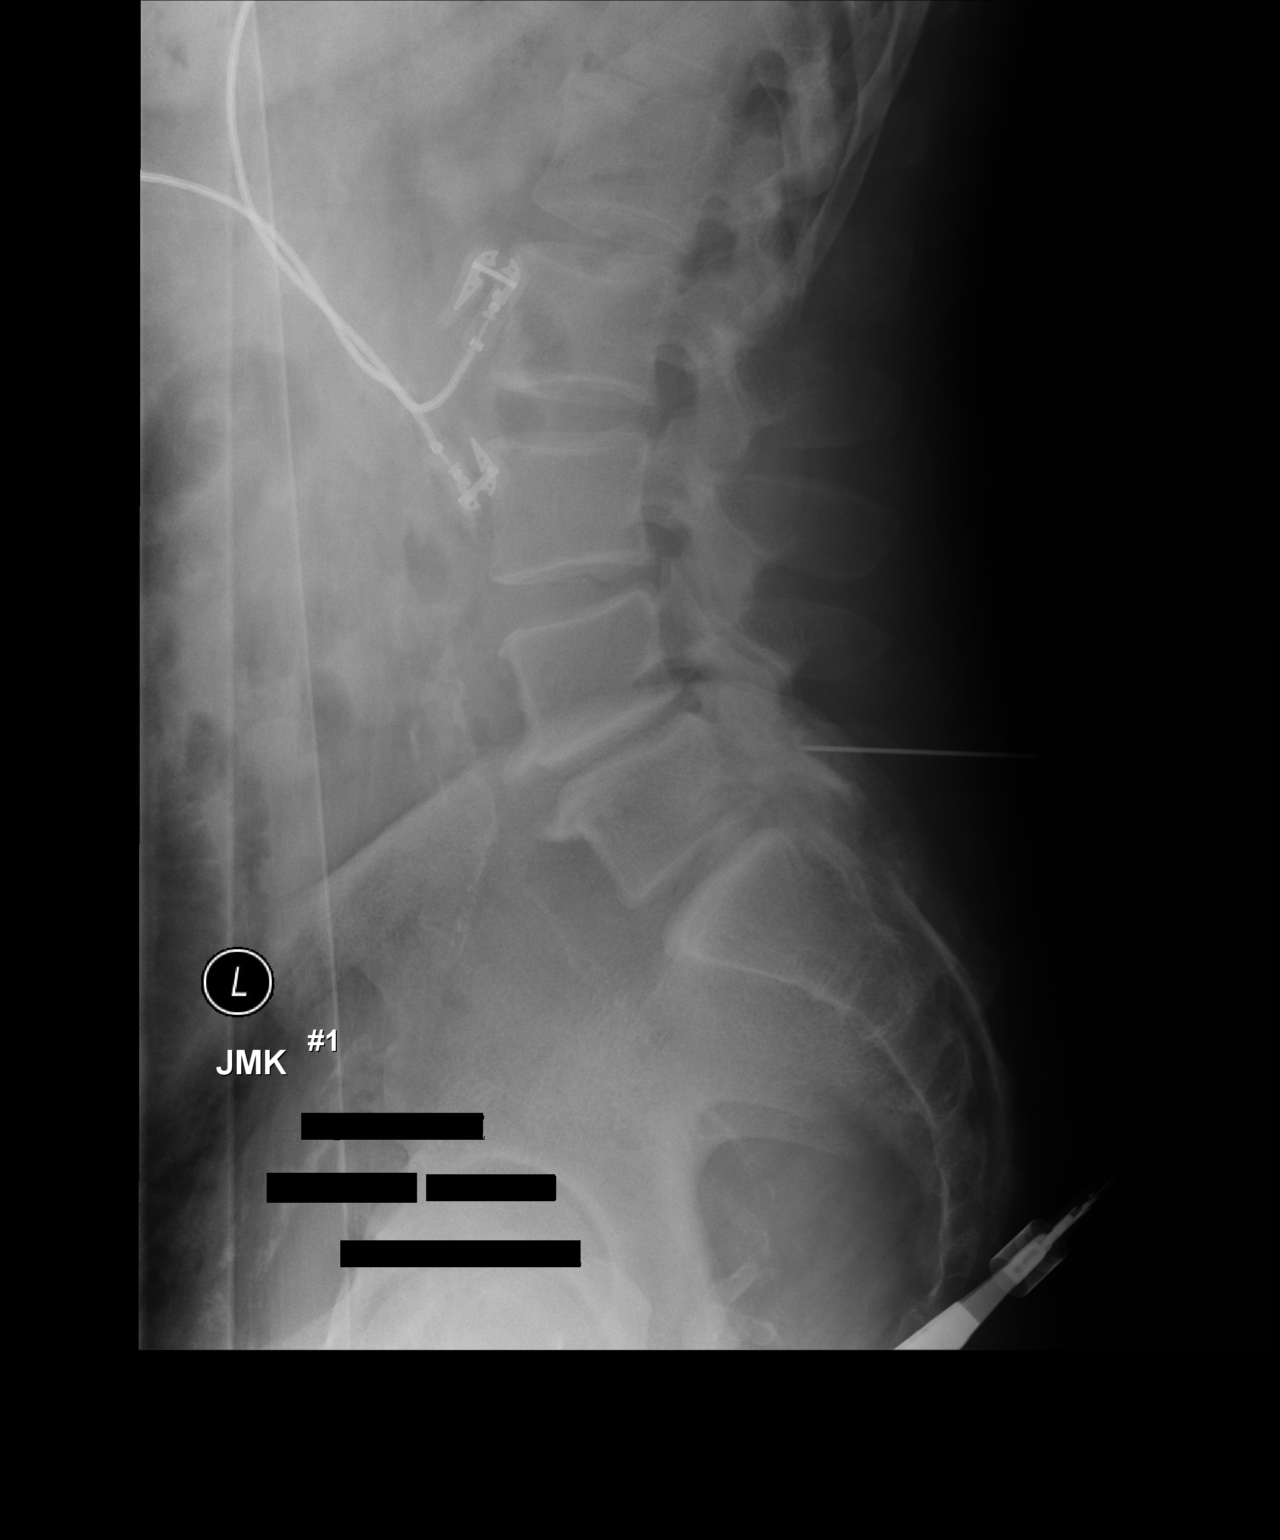

[xtable lateral (2 of 2)]
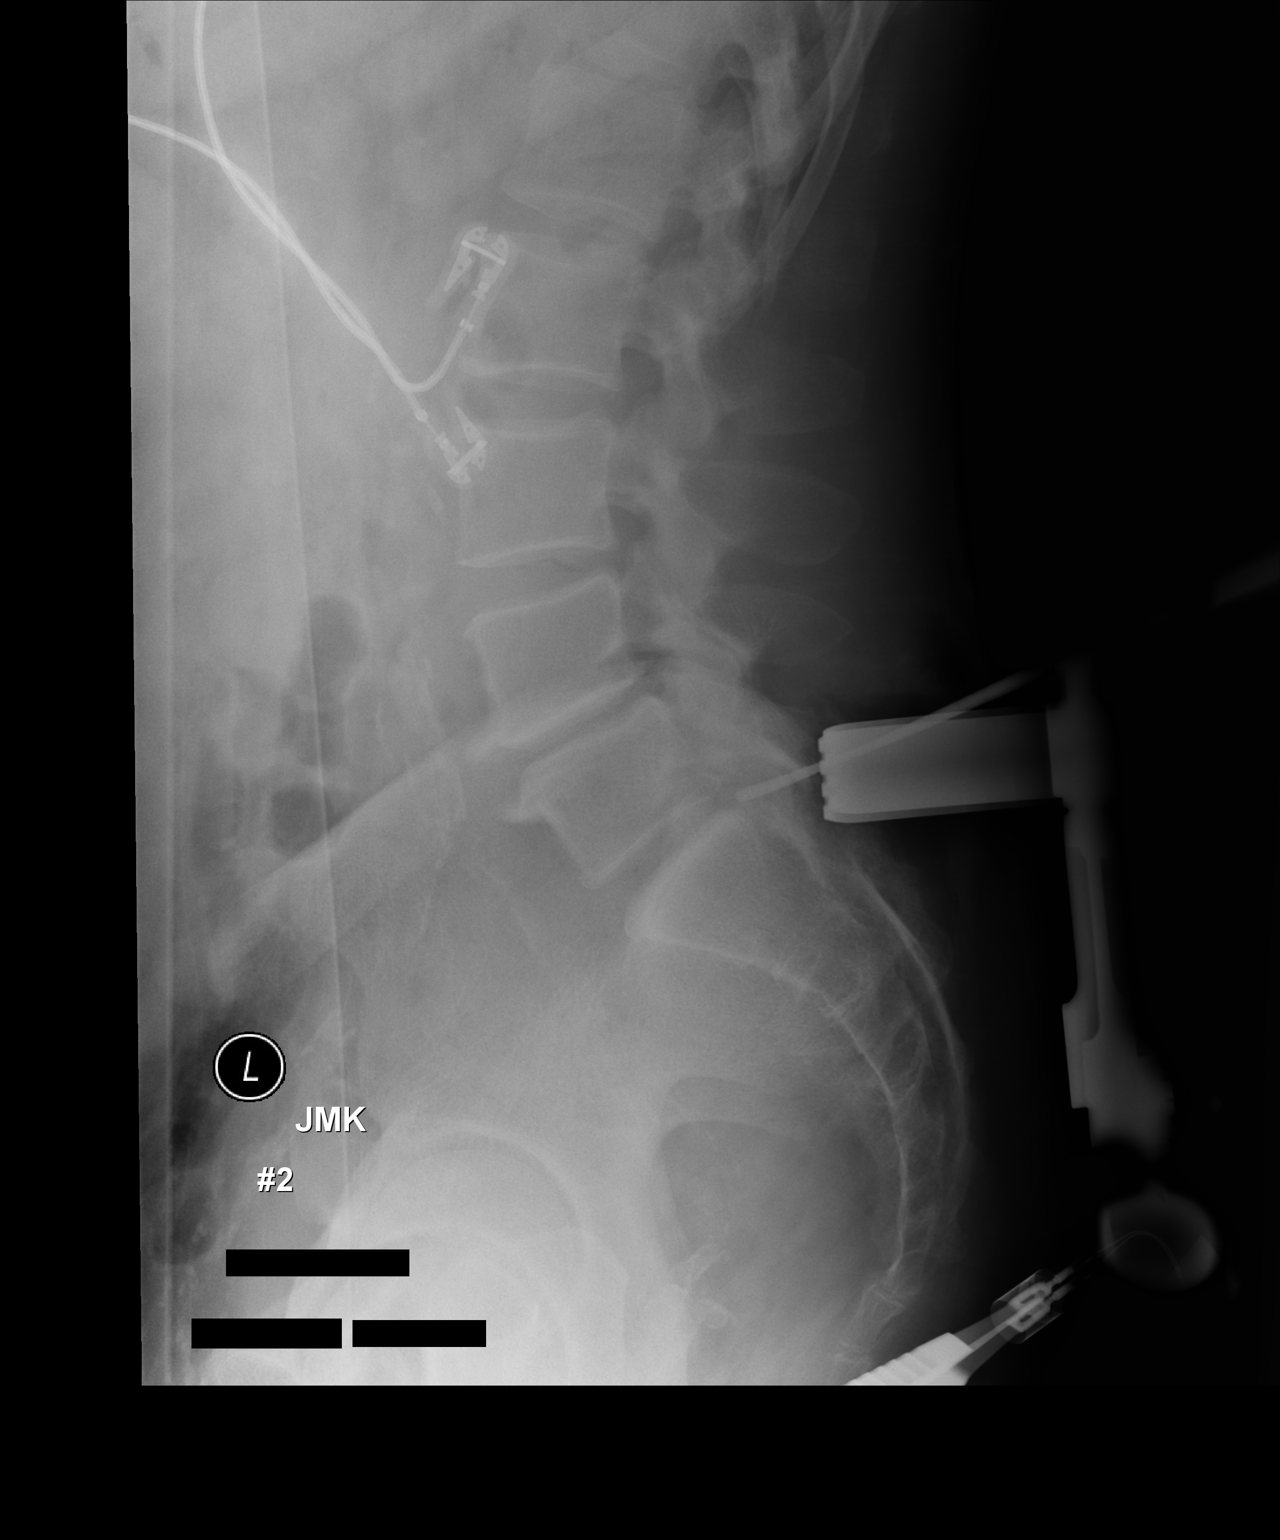

[2 of 2 positions shown; findings below may reference images not displayed]

FINDINGS: Two intraoperative lateral radiographs of the lumbar spine.

Initial lumbar radiograph demonstrates a surgical probe at the L5
posterior spinous process.

Second lateral radiograph demonstrates surgical hardware at the
L5-S1 level.
IMPRESSION: Intraoperative lumbar localization, as above.

## 2017-02-18 ENCOUNTER — Encounter: Payer: BLUE CROSS/BLUE SHIELD | Attending: Medical | Admitting: Dietician

## 2017-02-18 ENCOUNTER — Encounter: Payer: Self-pay | Admitting: Dietician

## 2017-02-18 DIAGNOSIS — E119 Type 2 diabetes mellitus without complications: Secondary | ICD-10-CM | POA: Diagnosis not present

## 2017-02-18 DIAGNOSIS — Z713 Dietary counseling and surveillance: Secondary | ICD-10-CM | POA: Insufficient documentation

## 2017-02-18 NOTE — Progress Notes (Signed)
Diabetes Self-Management Education  Visit Type: First/Initial  Appt. Start Time: 1530 Appt. End Time: 1630  02/18/2017  [name], identified by name and date of birth, is a 61 y.o. male with a diagnosis of Diabetes: Type 2.  Other hx includes delayed gastric emptying, diverticulosis, GERD, history of ETOH abuse per patient.  Patient has lost 5 lbs in the past month after making changes to his diet.   Patient's grandson lives with him and his girlfriend.  His girlfriend does the shopping and cooking.  He states that she is working to make healthier choices.  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  He used to eat one meal per day and now eats two so he can take his Metformin in the morning.  He is not very active and complains of increased foot pain and blockages of the vessels in his legs and tires easily.  He eats no milk or eggs and prefers vegetables over meat.  ASSESSMENT  Height 5\' 7"  (1.702 m), weight 194 lb (88 kg). Body mass index is 30.38 kg/m.      Diabetes Self-Management Education - 02/18/17 1543      Visit Information   Visit Type First/Initial     Initial Visit   Diabetes Type Type 2   Are you currently following a meal plan? Yes   What type of meal plan do you follow? less fried, more whole grain, less sugar   Are you taking your medications as prescribed? Yes   Date Diagnosed 12/2016     Health Coping   How would you rate your overall health? Fair     Psychosocial Assessment   Patient Belief/Attitude about Diabetes Motivated to manage diabetes   Self-care barriers None   Self-management support Doctor's office   Other persons present Patient;Family Member   Patient Concerns Nutrition/Meal planning;Glycemic Control;Weight Control   Special Needs None   Preferred Learning Style No preference indicated   Learning Readiness Ready   How often do you need to have someone help you when you read instructions, pamphlets, or other written  materials from your doctor or pharmacy? 1 - Never   What is the last grade level you completed in school? some college     Pre-Education Assessment   Patient understands the diabetes disease and treatment process. Needs Instruction   Patient understands incorporating nutritional management into lifestyle. Needs Instruction   Patient undertands incorporating physical activity into lifestyle. Needs Instruction   Patient understands using medications safely. Needs Instruction   Patient understands monitoring blood glucose, interpreting and using results Needs Instruction   Patient understands prevention, detection, and treatment of acute complications. Needs Instruction   Patient understands prevention, detection, and treatment of chronic complications. Needs Instruction   Patient understands how to develop strategies to address psychosocial issues. Needs Instruction   Patient understands how to develop strategies to promote health/change behavior. Needs Instruction     Complications   Last HgB A1C per patient/outside source 7 %  12/2016   How often do you check your blood sugar? 0 times/day (not testing)   Have you had a dilated eye exam in the past 12 months? Yes   Have you had a dental exam in the past 12 months? No   Are you checking your feet? No     Dietary Intake   Breakfast NABS and coffee with 1 splenda, cream OR leftovers OR sandwich  9-10 am   Snack (morning) none  Lunch skips   Snack (afternoon) none   Dinner [employer] spaghetti with meat sauce  8 pm   Snack (evening) trying not to.  was eating cake and cookes   Beverage(s) No regular soda in 3-4 months, Current:  water, crystal light     Exercise   Exercise Type Light (walking / raking leaves)  occasional [employer] pool or bike   How many days per week to you exercise? 7   How many minutes per day do you exercise? 30   Total minutes per week of exercise 210     Patient Education   Previous Diabetes Education No   Disease state   Definition of diabetes, type 1 and 2, and the diagnosis of diabetes;Factors that contribute to the development of diabetes   Nutrition management  Role of diet in the treatment of diabetes and the relationship between the three main macronutrients and blood glucose level;Food label reading, portion sizes and measuring food.;Meal options for control of blood glucose level and chronic complications.   Physical activity and exercise  Role of exercise on diabetes management, blood pressure control and cardiac health.;Helped patient identify appropriate exercises in relation to his/her diabetes, diabetes complications and other health issue.   Medications Reviewed patients medication for diabetes, action, purpose, timing of dose and side effects.   Monitoring Purpose and frequency of SMBG.;Identified appropriate SMBG and/or A1C goals.   Chronic complications Relationship between chronic complications and blood glucose control   Psychosocial adjustment Role of stress on diabetes     Individualized Goals (developed by patient)   Nutrition General guidelines for healthy choices and portions discussed   Physical Activity Exercise 5-7 days per week;30 minutes per day   Medications take my medication as prescribed   Reducing Risk do foot checks daily   Health Coping discuss diabetes with (comment)  MD/RD     Post-Education Assessment   Patient understands the diabetes disease and treatment process. Demonstrates understanding / competency   Patient understands incorporating nutritional management into lifestyle. Demonstrates understanding / competency   Patient undertands incorporating physical activity into lifestyle. Demonstrates understanding / competency   Patient understands using medications safely. Demonstrates understanding / competency   Patient understands monitoring blood glucose, interpreting and using results Demonstrates understanding / competency   Patient understands prevention, detection,  and treatment of acute complications. Demonstrates understanding / competency   Patient understands prevention, detection, and treatment of chronic complications. Demonstrates understanding / competency   Patient understands how to develop strategies to address psychosocial issues. Demonstrates understanding / competency   Patient understands how to develop strategies to promote health/change behavior. Demonstrates understanding / competency     Outcomes   Expected Outcomes Demonstrated interest in learning. Expect positive outcomes   Future DMSE PRN   Program Status Completed      Individualized Plan for Diabetes Self-Management Training:   Learning Objective:  Patient will have a greater understanding of diabetes self-management. Patient education plan is to attend individual and/or group sessions per assessed needs and concerns.   Plan:   Patient Instructions  Continue to make the changes that you have made.  Decreasing sugar  Changing to whole grain bread and pasta and a smaller portion.  Bake rather than fry   Stop eating when you are satisfied.  Avoid overeating. Increase your intake of non starchy vegetables. Limit your intake of added fats. Aim to be active most days. Consider less cheese.      Expected Outcomes:  Demonstrated interest in learning.  Expect positive outcomes  Education material provided: Living Well with Diabetes, Food label handouts, A1C conversion sheet, Meal plan card, My Plate and Snack sheet  If problems or questions, patient to contact team via:  Phone  Future DSME appointment: PRN

## 2017-02-18 NOTE — Patient Instructions (Addendum)
Continue to make the changes that you have made.  Decreasing sugar  Changing to whole grain bread and pasta and a smaller portion.  Bake rather than fry   Stop eating when you are satisfied.  Avoid overeating. Increase your intake of non starchy vegetables. Limit your intake of added fats. Aim to be active most days. Consider less cheese.

## 2017-02-19 ENCOUNTER — Encounter (INDEPENDENT_AMBULATORY_CARE_PROVIDER_SITE_OTHER): Payer: Self-pay | Admitting: Orthopaedic Surgery

## 2017-02-19 ENCOUNTER — Ambulatory Visit (INDEPENDENT_AMBULATORY_CARE_PROVIDER_SITE_OTHER): Payer: BLUE CROSS/BLUE SHIELD | Admitting: Orthopaedic Surgery

## 2017-02-19 VITALS — BP 124/80 | HR 73 | Ht 67.0 in | Wt 194.0 lb

## 2017-02-19 DIAGNOSIS — Z9889 Other specified postprocedural states: Secondary | ICD-10-CM
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
## 2017-02-21 ENCOUNTER — Encounter: Payer: Self-pay | Admitting: Medical

## 2017-03-20 ENCOUNTER — Other Ambulatory Visit: Payer: Self-pay | Admitting: Medical

## 2017-04-09 ENCOUNTER — Other Ambulatory Visit: Payer: Self-pay | Admitting: Medical

## 2017-04-14 ENCOUNTER — Other Ambulatory Visit: Payer: Self-pay | Admitting: Medical

## 2017-05-11 ENCOUNTER — Other Ambulatory Visit: Payer: Self-pay | Admitting: Medical

## 2017-06-19 ENCOUNTER — Other Ambulatory Visit: Payer: Self-pay | Admitting: Medical

## 2017-06-27 ENCOUNTER — Telehealth: Payer: Self-pay | Admitting: Medical

## 2017-06-27 ENCOUNTER — Ambulatory Visit: Payer: BLUE CROSS/BLUE SHIELD | Admitting: Medical

## 2017-06-27 VITALS — BP 124/70 | HR 70 | Wt 181.4 lb

## 2017-06-27 DIAGNOSIS — Z7189 Other specified counseling: Secondary | ICD-10-CM

## 2017-06-27 DIAGNOSIS — E119 Type 2 diabetes mellitus without complications: Secondary | ICD-10-CM

## 2017-06-27 DIAGNOSIS — K3184 Gastroparesis: Secondary | ICD-10-CM

## 2017-06-27 DIAGNOSIS — E785 Hyperlipidemia, unspecified: Secondary | ICD-10-CM | POA: Diagnosis not present

## 2017-06-27 DIAGNOSIS — R0989 Other specified symptoms and signs involving the circulatory and respiratory systems: Secondary | ICD-10-CM | POA: Diagnosis not present

## 2017-06-27 DIAGNOSIS — L608 Other nail disorders: Secondary | ICD-10-CM

## 2017-06-27 DIAGNOSIS — Z7185 Encounter for immunization safety counseling: Secondary | ICD-10-CM

## 2017-06-27 DIAGNOSIS — I739 Peripheral vascular disease, unspecified: Secondary | ICD-10-CM | POA: Insufficient documentation

## 2017-06-27 DIAGNOSIS — Z8619 Personal history of other infectious and parasitic diseases: Secondary | ICD-10-CM | POA: Diagnosis not present

## 2017-06-27 DIAGNOSIS — K7400 Hepatic fibrosis, unspecified: Secondary | ICD-10-CM

## 2017-06-27 DIAGNOSIS — E118 Type 2 diabetes mellitus with unspecified complications: Secondary | ICD-10-CM | POA: Diagnosis not present

## 2017-06-27 LAB — POCT GLYCOSYLATED HEMOGLOBIN (HGB A1C): Hemoglobin A1C: 6.2

## 2017-06-27 NOTE — Progress Notes (Signed)
Subjective:  Joshua Lester is a 62 y.o. male who presents for med check.  At his last visit we started him on a statin for cholesterol which he reports being compliant.  He is not checking his sugars, he is compliant with metformin.    He complains of ongoing pains in his legs and feet when walking.  He did have lower extremity blood flow screening a few months ago but says he never heard back about referral for specialist.    he has seen his liver doctor since last visit here.He has a history of fibrosis, prior hepatitis C  Has chronic nail deformity of toenails in the right hand but not the left hand.  No other aggravating or relieving factors.    No other c/o.  The following portions of the patient's history were reviewed and updated as appropriate: allergies, current medications, past family history, past medical history, past social history, past surgical history and problem list.  ROS Otherwise as in subjective above   Past Medical History:  Diagnosis Date  . Delayed gastric emptying 2016   per study   . Diabetes mellitus without complication (HCC)   . Diverticulosis   . GERD (gastroesophageal reflux disease)   . Hepatitis C, chronic (HCC) JUL 2016  . Lumbar herniated disc   . Marijuana abuse 01/16/2015   Current Outpatient Medications on File Prior to Visit  Medication Sig Dispense Refill  . aspirin 81 MG chewable tablet Chew by mouth daily.    . metFORMIN (GLUCOPHAGE) 500 MG tablet TAKE 1 TABLET BY MOUTH ONCE DAILY WITH  BREAKFAST. 30 tablet 0  . pantoprazole (PROTONIX) 40 MG tablet Take 1 tablet (40 mg total) by mouth daily. Take 30 minutes prior to breakfast 90 tablet 0  . pravastatin (PRAVACHOL) 20 MG tablet TAKE 1 TABLET BY MOUTH AT BEDTIME 90 tablet 0  . tamsulosin (FLOMAX) 0.4 MG CAPS capsule TAKE 1 CAPSULE BY MOUTH ONCE DAILY 30 capsule 3   No current facility-administered medications on file  prior to visit.      Objective: BP 124/70   Pulse 70   Wt 181 lb 6.4 oz (82.3 kg)   SpO2 97%   BMI 28.41 kg/m   General appearance: alert, no distress, WD/WN Neck: supple, no lymphadenopathy, no thyromegaly, no masses Heart: RRR, normal S1, S2, no murmurs Lungs: CTA bilaterally, no wheezes, rhonchi, or rales Abdomen: +bs, soft, non tender, non distended, no masses, no hepatomegaly, no splenomegaly Pulses: 2+ upper extremity pulses, 1+ pedal pulses, normal cap refill  thickening nails toenails bilaterally and right hand but not the left hand. Ext: no edema   Diabetic Foot Exam - Simple   Simple Foot Form Diabetic Foot exam was performed with the following findings:  Yes 06/27/2017  7:47 PM  Visual Inspection See comments:  Yes Sensation Testing See comments:  Yes Pulse Check See comments:  Yes Comments Decreased sensation bilaterally with monofilament test, thick and brown toenails bilaterally, 1+ pedal pulses      Assessment: Encounter Diagnoses  Name Primary?  . Diabetes mellitus with complication (HCC) Yes  . Nail deformity   . Claudication (HCC)   . Decreased pedal pulses   . Liver fibrosis   . History of hepatitis C   . Gastroparesis   . Vaccine counseling   . Hyperlipidemia, unspecified hyperlipidemia type   . Diabetes mellitus without complication (HCC)      Plan: Diabetes - counseled on diet, exercise, c/t same medication  Nail deformity -  referral to podiatry, and possible referral to dermatology given hand nail deformity right hand  Claudication, decreased pulses - reviewed abnormal ABI screen from summer 2018.  Refer to vascular specialist  Liver fibrosis, hx/o hep c - reviewed prior office notes from 04/2017.     Vaccine counseling - he declines vaccines today but counseled on flu shot, Tdap, Shingrix  hyperlipidemia - labs today, c/t statin  Dianne was seen today for diabetes.  Diagnoses and all orders for this visit:  Diabetes  mellitus with complication [facility]) -     Ambulatory referral to Podiatry -     Ambulatory referral to Vascular Surgery -     CBC; Future -     Lipid panel; Future -     PSA; Future -     Microalbumin / creatinine urine ratio; Future -     HM DIABETES EYE EXAM -     HM DIABETES FOOT EXAM -     Hepatic function panel; Future -     Renal Function Panel; Future  Nail deformity -     Ambulatory referral to Podiatry  Claudication [facility]) -     Ambulatory referral to Vascular Surgery  Decreased pedal pulses -     Ambulatory referral to Vascular Surgery  Liver fibrosis  History of hepatitis C -     CBC; Future -     Hepatic function panel; Future  Gastroparesis  Vaccine counseling  Hyperlipidemia, unspecified hyperlipidemia type -     Lipid panel; Future  Diabetes mellitus without complication (HCC) Comments: error Orders: -     HgB A1c    Follow up: pending call back

## 2017-06-27 NOTE — Telephone Encounter (Signed)
Call Billy Fischerawn Drazec, NP office at liver /hepatitis clinic.  Please find out what labs she wants him to have so we can add to our labs.  I will have to have him return fasting  Referring to podiatry if he has not seen one for nail anomaly and diabetic foot care  Referred to vascular vein clinic for claudication, decreased pulses in feet, abnormal ABI screen

## 2017-06-28 NOTE — Telephone Encounter (Signed)
See message below pt referral have already been done.

## 2017-06-28 NOTE — Telephone Encounter (Signed)
Called and l/m for at the liver clinic for them to call me back , sent referral to [facility], and [facility].

## 2017-06-28 NOTE — Telephone Encounter (Signed)
Joshua Lester called back from the liver clinic , she  York SpanielSaid that she need lft done on him and will fax the order to us.

## 2017-06-28 NOTE — Telephone Encounter (Signed)
Made an appt for Monday at 9:00am

## 2017-06-28 NOTE — Telephone Encounter (Signed)
1- have him come in for FASTING labs.  They are ordered in the computer 2-refer to vascular surgery for claudication and abnormal ABIs 3- refer ot podiatry for diabetes nail care and thickened nails 4-I recommend you have a shingles vaccine to help prevent shingles or herpes zoster outbreak.   Please call your insurer to inquire about coverage for the Shingrix vaccine given in 2 doses.   Some insurers cover this vaccine after age 60, some cover this after age 60.  If your insurer covers this, then call to schedule appointment to have this vaccine here.

## 2017-07-01 ENCOUNTER — Other Ambulatory Visit: Payer: BLUE CROSS/BLUE SHIELD

## 2017-07-01 DIAGNOSIS — Z8619 Personal history of other infectious and parasitic diseases: Secondary | ICD-10-CM

## 2017-07-01 DIAGNOSIS — K7400 Hepatic fibrosis, unspecified: Secondary | ICD-10-CM

## 2017-07-01 DIAGNOSIS — E785 Hyperlipidemia, unspecified: Secondary | ICD-10-CM

## 2017-07-01 DIAGNOSIS — E118 Type 2 diabetes mellitus with unspecified complications: Secondary | ICD-10-CM

## 2017-07-02 LAB — LIPID PANEL
CHOL/HDL RATIO: 3 (calc) (ref <5.0)
Cholesterol: 104 mg/dL (ref <200)
HDL: 35 mg/dL — ABNORMAL LOW (ref >40)
LDL Cholesterol (Calc): 55 mg/dL (calc)
NON-HDL CHOLESTEROL (CALC): 69 mg/dL (ref <130)
TRIGLYCERIDES: 63 mg/dL (ref <150)

## 2017-07-02 LAB — HEPATIC FUNCTION PANEL
AG RATIO: 1.1 (calc) (ref 1.0–2.5)
ALKALINE PHOSPHATASE (APISO): 68 U/L (ref 40–115)
ALT: 10 U/L (ref 9–46)
AST: 11 U/L (ref 10–35)
Albumin: 4.1 g/dL (ref 3.6–5.1)
BILIRUBIN INDIRECT: 0.4 mg/dL (ref 0.2–1.2)
BILIRUBIN TOTAL: 0.5 mg/dL (ref 0.2–1.2)
Bilirubin, Direct: 0.1 mg/dL (ref 0.0–0.2)
GLOBULIN: 3.8 g/dL — AB (ref 1.9–3.7)
Total Protein: 7.9 g/dL (ref 6.1–8.1)

## 2017-07-02 LAB — CBC
HCT: 44.5 % (ref 38.5–50.0)
Hemoglobin: 15 g/dL (ref 13.2–17.1)
MCH: 28.9 pg (ref 27.0–33.0)
MCHC: 33.7 g/dL (ref 32.0–36.0)
MCV: 85.7 fL (ref 80.0–100.0)
MPV: 9.6 fL (ref 7.5–12.5)
PLATELETS: 220 10*3/uL (ref 140–400)
RBC: 5.19 10*6/uL (ref 4.20–5.80)
RDW: 13.6 % (ref 11.0–15.0)
WBC: 9.6 10*3/uL (ref 3.8–10.8)

## 2017-07-02 LAB — PROTIME-INR
INR: 1
Prothrombin Time: 10.8 s (ref 9.0–11.5)

## 2017-07-02 LAB — RENAL FUNCTION PANEL
Albumin: 4.1 g/dL (ref 3.6–5.1)
BUN: 12 mg/dL (ref 7–25)
CHLORIDE: 103 mmol/L (ref 98–110)
CO2: 29 mmol/L (ref 20–32)
Calcium: 9.2 mg/dL (ref 8.6–10.3)
Creat: 1.01 mg/dL (ref 0.70–1.33)
Glucose, Bld: 99 mg/dL (ref 65–99)
POTASSIUM: 4.5 mmol/L (ref 3.5–5.3)
Phosphorus: 3 mg/dL (ref 2.5–4.5)
Sodium: 138 mmol/L (ref 135–146)

## 2017-07-02 LAB — PSA: PSA: 1 ng/mL (ref <=4.0)

## 2017-07-02 LAB — MICROALBUMIN / CREATININE URINE RATIO
CREATININE, URINE: 56 mg/dL (ref 20–320)
MICROALB UR: 5.1 mg/dL
Microalb Creat Ratio: 91 mcg/mg creat — ABNORMAL HIGH (ref <30)

## 2017-07-02 LAB — AFP TUMOR MARKER: AFP-Tumor Marker: 3.2 ng/mL (ref <6.1)

## 2017-07-03 ENCOUNTER — Other Ambulatory Visit: Payer: Self-pay | Admitting: Medical

## 2017-07-03 MED ORDER — METFORMIN HCL 500 MG PO TABS: ORAL_TABLET | ORAL | 3 refills | Status: DC

## 2017-07-03 MED ORDER — TAMSULOSIN HCL 0.4 MG PO CAPS: 0.4000 mg | ORAL_CAPSULE | Freq: Every day | ORAL | 3 refills | Status: DC

## 2017-07-03 MED ORDER — PRAVASTATIN SODIUM 20 MG PO TABS: 20.0000 mg | ORAL_TABLET | Freq: Every day | ORAL | 3 refills | Status: DC

## 2017-07-03 MED ORDER — PANTOPRAZOLE SODIUM 40 MG PO TBEC: 40.0000 mg | DELAYED_RELEASE_TABLET | Freq: Every day | ORAL | 3 refills | Status: DC

## 2017-07-03 MED ORDER — LISINOPRIL 5 MG PO TABS: 5.0000 mg | ORAL_TABLET | Freq: Every day | ORAL | 0 refills | Status: DC

## 2017-07-05 ENCOUNTER — Ambulatory Visit: Payer: BLUE CROSS/BLUE SHIELD | Admitting: Podiatry

## 2017-07-05 ENCOUNTER — Encounter: Payer: Self-pay | Admitting: Podiatry

## 2017-07-05 ENCOUNTER — Ambulatory Visit (INDEPENDENT_AMBULATORY_CARE_PROVIDER_SITE_OTHER): Payer: BLUE CROSS/BLUE SHIELD

## 2017-07-05 VITALS — BP 136/84 | HR 79

## 2017-07-05 DIAGNOSIS — M205X1 Other deformities of toe(s) (acquired), right foot: Secondary | ICD-10-CM

## 2017-07-05 DIAGNOSIS — R52 Pain, unspecified: Secondary | ICD-10-CM | POA: Diagnosis not present

## 2017-07-05 DIAGNOSIS — M722 Plantar fascial fibromatosis: Secondary | ICD-10-CM | POA: Diagnosis not present

## 2017-07-05 DIAGNOSIS — B351 Tinea unguium: Secondary | ICD-10-CM

## 2017-07-05 DIAGNOSIS — M79675 Pain in left toe(s): Secondary | ICD-10-CM

## 2017-07-05 DIAGNOSIS — M79674 Pain in right toe(s): Secondary | ICD-10-CM

## 2017-07-05 MED ORDER — MELOXICAM 7.5 MG PO TABS: 7.5000 mg | ORAL_TABLET | Freq: Every day | ORAL | 0 refills | Status: AC

## 2017-07-05 NOTE — Progress Notes (Signed)
   Subjective:    Patient ID: Joshua Lester, male    DOB: 07-17-1955, 62 y.o.   MRN: [id number]  HPIthis patient presents the office stating that he is diabetic and desires an evaluation of his foot and also nail care.  Patient states that the nails are thick and long and are painful walking and wearing his shoes he says that his right foot becomes painful and cramps which has caused him to lower his walking and increase his driving at work.  He says there are times that her right foot is extremely painful and throbbing.  He says he has previously been diagnosed as having claudication in his right lower leg.  He says he has no history of trauma or injury to the feet.  He presents the office today for an evaluation of his diabetic feet as well as nail care.    Review of Systems  All other systems reviewed and are negative.      Objective:   Physical Exam General Appearance  Alert, conversant and in no acute stress.  Vascular  Dorsalis pedis and posterior pulses are palpable  bilaterally.  Capillary return is within normal limits  bilaterally. Temperature is within normal limits  Bilaterally.  Neurologic  Senn-Weinstein monofilament wire test within normal limits  bilaterally. Muscle power within normal limits bilaterally.  Nails Thick disfigured discolored nails with subungual debris bilaterally from hallux to fifth toes bilaterally. No evidence of bacterial infection or drainage bilaterally.  Orthopedic  No limitations of motion of motion feet bilaterally.  No crepitus or effusions noted.  Mild HAV deformity first MPJ both feet.  Patient has freed them of motion first MPJ left foot with tightness noted first MPJ of the right foot.  Palpable pain noted through the plantar fascia of the right foot.  Skin  normotropic skin with no porokeratosis noted bilaterally.  No signs of infections or ulcers noted.          Assessment & Plan:  Onychomycosis  B/L  Diabetes with vascular  disease  Functional Hallux limitus  Plantar fasciitis.  Treatment  IE  X-rays taken reveal no evidence of any arthritic changes first MPJ bilaterally.  There is dorsal crowning first metatarsal bilaterally.  Palpable pain along the course of the plantar fascia right foot.  Discussed his foot pain persists due to  functional hallux limitus as well as plantar fasciitis.  I recommended that he start with power step insoles and prescribed Mobic 7.5 mg to help improve his motion and less than his pain.  He also relates having lower back pain.  I recommended power step insoles  be worn for 2 weeks and then to consider orthotics with a kinetic wedge.  RTC 2 weeks. Patient says his liver is now healthy.   Joshua GuntherGregory Zaiah Lester DPM

## 2017-07-06 ENCOUNTER — Encounter: Payer: Self-pay | Admitting: Podiatry

## 2017-07-09 ENCOUNTER — Other Ambulatory Visit: Payer: Self-pay

## 2017-07-09 DIAGNOSIS — I739 Peripheral vascular disease, unspecified: Secondary | ICD-10-CM

## 2017-07-09 DIAGNOSIS — R6889 Other general symptoms and signs: Secondary | ICD-10-CM

## 2017-07-10 ENCOUNTER — Encounter: Payer: Self-pay | Admitting: Surgery

## 2017-07-10 ENCOUNTER — Ambulatory Visit (INDEPENDENT_AMBULATORY_CARE_PROVIDER_SITE_OTHER): Payer: BLUE CROSS/BLUE SHIELD | Admitting: Surgery

## 2017-07-10 ENCOUNTER — Ambulatory Visit (HOSPITAL_COMMUNITY)
Admission: RE | Admit: 2017-07-10 | Discharge: 2017-07-10 | Disposition: A | Discharge status: Home / Self Care | Payer: BLUE CROSS/BLUE SHIELD | Source: Ambulatory Visit | Attending: Surgery | Admitting: Surgery

## 2017-07-10 ENCOUNTER — Other Ambulatory Visit: Payer: Self-pay

## 2017-07-10 VITALS — BP 117/77 | HR 68 | Temp 98.3°F | Resp 18 | Ht 67.5 in | Wt 188.0 lb

## 2017-07-10 DIAGNOSIS — R6889 Other general symptoms and signs: Secondary | ICD-10-CM | POA: Insufficient documentation

## 2017-07-10 DIAGNOSIS — I70293 Other atherosclerosis of native arteries of extremities, bilateral legs: Secondary | ICD-10-CM | POA: Diagnosis not present

## 2017-07-10 DIAGNOSIS — I70213 Atherosclerosis of native arteries of extremities with intermittent claudication, bilateral legs: Secondary | ICD-10-CM

## 2017-07-10 DIAGNOSIS — I739 Peripheral vascular disease, unspecified: Secondary | ICD-10-CM

## 2017-07-10 LAB — VAS US LOWER EXTREMITY ARTERIAL DUPLEX
LEFT PERO DIST SYS: -19 cm/s
LPOPDPSV: -40 cm/s
LPOPPPSV: 37 cm/s
Left ant tibial distal sys: -35 cm/s
Left super femoral prox sys PSV: -11 cm/s
RIGHT ANT DIST TIBAL SYS PSV: -17 cm/s
RIGHT POST TIB DIST SYS: -44 cm/s
RPOPDPSV: -35 cm/s
RPOPPPSV: 16 cm/s
RSFPPSV: -47 cm/s
Right peroneal sys PSV: -13 cm/s
left post tibial dist sys: -50 cm/s

## 2017-07-10 NOTE — Progress Notes (Signed)
[facility] [place]  Patient name: Joshua Lester MRN: [id number] DOB: [birth year] Sex: male   REQUESTING PROVIDER:    Crosby Oyster   REASON FOR CONSULT:    claudication  HISTORY OF PRESENT ILLNESS:   Joshua Lester is a 62 y.o. male, who is here today for evaluation of leg pain.  He states that he has been having pain in his legs for approximately 10 years, however it has gotten worse over the past 3 years.  He has undergone back surgery which help with his sciatic issues.  He now complains of cramping in his legs giving out, inhibiting his ability to walk more than 2-300 feet.  His right leg is slightly worse than the left.  He does not have any open wounds.  He does say he wakes up at night with pain in his feet.    The patient suffers from diabetes.  He takes an ACE inhibitor for hypertension.  He is a former smoker.  PAST MEDICAL HISTORY    Past Medical History:  Diagnosis Date  . Delayed gastric emptying 2016   per study   . Diabetes mellitus without complication (HCC)   . Diverticulosis   . GERD (gastroesophageal reflux disease)   . Hepatitis C, chronic (HCC) JUL 2016  . Lumbar herniated disc   . Marijuana abuse 01/16/2015     FAMILY HISTORY   Family History  Problem Relation Age of Onset  . Cancer Mother   . Colon cancer Neg Hx   . Diabetes Neg Hx   . Heart disease Neg Hx   . Stroke Neg Hx   . Hyperlipidemia Neg Hx     SOCIAL HISTORY:   Social History   Socioeconomic History  . Marital status: Widowed    Spouse name: Not on file  . Number of children: Not on file  . Years of education: Not on file  . Highest education level: Not on file  Social Needs  . Financial resource strain: Not on file  . Food insecurity - worry: Not on file  . Food insecurity - inability: Not on file  . Transportation needs - medical: Not on file  . Transportation needs - non-medical: Not on file    Occupational History  . Not on file  Tobacco Use  . Smoking status: Former Smoker    Packs/day: 0.50    Years: 40.00    Pack years: 20.00    Types: Cigarettes    Last attempt to quit: 01/13/2012    Years since quitting: 5.4  . Smokeless tobacco: Never Used  Substance and Sexual Activity  . Alcohol use: No    Alcohol/week: 0.0 oz    Comment: no ETOH for 25 years  . Drug use: Yes    Types: Marijuana    Comment: Last used a month ago (March 2017)  History of IV drug use as a teenager  . Sexual activity: Yes  Other Topics Concern  . 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  Exercise - some walking.  "works a lot, stays out of trouble, and doesn't spend any money."  As of 05/2016.    ALLERGIES:    No Known Allergies  CURRENT MEDICATIONS:    Current Outpatient Medications  Medication Sig Dispense Refill  . aspirin 81 MG chewable tablet Chew by mouth daily.    Marland Kitchen lisinopril (PRINIVIL,ZESTRIL) 5 MG tablet Take 1 tablet (5 mg  total) by mouth daily. 90 tablet 0  . meloxicam (MOBIC) 7.5 MG tablet Take 1 tablet (7.5 mg total) by mouth daily. 30 tablet 0  . metFORMIN (GLUCOPHAGE) 500 MG tablet TAKE 1 TABLET BY MOUTH ONCE DAILY WITH  BREAKFAST. 90 tablet 3  . pantoprazole (PROTONIX) 40 MG tablet Take 1 tablet (40 mg total) by mouth daily. Take 30 minutes prior to breakfast 90 tablet 3  . pravastatin (PRAVACHOL) 20 MG tablet Take 1 tablet (20 mg total) by mouth at bedtime. 90 tablet 3  . tamsulosin (FLOMAX) 0.4 MG CAPS capsule Take 1 capsule (0.4 mg total) by mouth daily. 90 capsule 3   No current facility-administered medications for this visit.     REVIEW OF SYSTEMS:   [X]  denotes positive finding, [ ]  denotes negative finding Cardiac  Comments:  Chest pain or chest pressure:    Shortness of breath upon exertion: x   Short of breath when lying flat:    Irregular heart rhythm:        Vascular    Pain in calf, thigh, or hip brought  on by ambulation: x   Pain in feet at night that wakes you up from your sleep:  x   Blood clot in your veins:    Leg swelling:         Pulmonary    Oxygen at home:    Productive cough:     Wheezing:         Neurologic    Sudden weakness in arms or legs:  x   Sudden numbness in arms or legs:  x   Sudden onset of difficulty speaking or slurred speech:    Temporary loss of vision in one eye:     Problems with dizziness:         Gastrointestinal    Blood in stool:      Vomited blood:         Genitourinary    Burning when urinating:     Blood in urine:        Psychiatric    Major depression:         Hematologic    Bleeding problems:    Problems with blood clotting too easily:        Skin    Rashes or ulcers:        Constitutional    Fever or chills:     PHYSICAL EXAM:   Vitals:   07/10/17 1431  BP: 117/77  Pulse: 68  Resp: 18  Temp: 98.3 F (36.8 C)  TempSrc: Oral  SpO2: 97%  Weight: 188 lb (85.3 kg)  Height: 5' 7.5" (1.715 m)    GENERAL: The patient is a well-nourished male, in no acute distress. The vital signs are documented above. CARDIAC: There is a regular rate and rhythm.  VASCULAR: Palpable femoral pulses.  Pedal pulses are nonpalpable PULMONARY: Nonlabored respirations ABDOMEN: Soft and non-tender with normal pitched bowel sounds.  MUSCULOSKELETAL: There are no major deformities or cyanosis. NEUROLOGIC: No focal weakness or paresthesias are detected. SKIN: There are no ulcers or rashes noted. PSYCHIATRIC: The patient has a normal affect.  STUDIES:   I have reviewed his vascular lab studies.  This shows bilateral superficial femoral artery occlusion.  Ultrasound shows monophasic waveforms in the femoral arteries.  ASSESSMENT and PLAN   Bilateral claudication: The patient's symptoms are multifactorial, both from his lower back spine issues as well as poor circulation.  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  His  walking appears to be most limited by his circulation.  I told him that we would first begin with medical management.  I discussed the importance of blood pressure control and regulation of his diabetes.  He should also be started on a statin, and take a 81 mg aspirin.  I discussed starting a exercise program at what this program would look like.  I am also giving him a prescription for cilostazol to see if this will help improve his walking distance.  The patient will try to start making these changes and I will have him here for follow-up in 2 months.  If he has had not had resolution of his symptoms, we would consider starting him via angiography.   Durene CalWells Mclean Moya, MD [facility] Tel [phone] Pager [phone]

## 2017-07-11 ENCOUNTER — Other Ambulatory Visit: Payer: Self-pay | Admitting: Podiatry

## 2017-07-11 DIAGNOSIS — M205X1 Other deformities of toe(s) (acquired), right foot: Secondary | ICD-10-CM

## 2017-07-19 ENCOUNTER — Encounter: Payer: Self-pay | Admitting: Podiatry

## 2017-07-19 ENCOUNTER — Ambulatory Visit: Payer: BLUE CROSS/BLUE SHIELD | Admitting: Podiatry

## 2017-07-19 DIAGNOSIS — M722 Plantar fascial fibromatosis: Secondary | ICD-10-CM

## 2017-07-19 DIAGNOSIS — M205X1 Other deformities of toe(s) (acquired), right foot: Secondary | ICD-10-CM | POA: Diagnosis not present

## 2017-07-19 DIAGNOSIS — I739 Peripheral vascular disease, unspecified: Secondary | ICD-10-CM

## 2017-07-19 DIAGNOSIS — E119 Type 2 diabetes mellitus without complications: Secondary | ICD-10-CM | POA: Diagnosis not present

## 2017-07-19 NOTE — Progress Notes (Signed)
This patient presents to the office for reevaluation of his feet.  This patient is a diabetic with vascular disease, namely intermittent claudication in his legs.  He also was diagnosed with plantar fasciitis secondary to a functional hallux limitus.  Patient states that he has been taking his Mobic 7.5 mg one twice a day.  He says that the cramps are now almost gone.  Marland Kitchen. He says his left foot is painful, but his right foot has minimal pain and discomfort.  He is very pleased with his improvement on the Mobic. He returns to the office today for an evaluation of his feet and check callus the medication is helping his foot.  He does admit he never did pick up the power step insoles but plans to in the near future.   General Appearance  Alert, conversant and in no acute stress.  Vascular  Dorsalis pedis and posterior pulses are palpable  bilaterally.  Capillary return is within normal limits  bilaterally. Temperature is within normal limits  Bilaterally.  Neurologic  Senn-Weinstein monofilament wire test within normal limits  bilaterally. Muscle power within normal limits bilaterally.  Nails Thick disfigured discolored nails with subungual debris bilaterally from hallux to fifth toes bilaterally. No evidence of bacterial infection or drainage bilaterally.  Orthopedic  No limitations of motion of motion feet bilaterally.  No crepitus or effusions noted.  Patient does have better motion in the first MPJ with minimal pain along the course of the plantar fascia both feet.    Skin  normotropic skin with no porokeratosis noted bilaterally.  No signs of infections or ulcers noted.    Functional hallux limitus  1st MPJ  B/L.  Resolved plantar fascia.    ROV.  After discussion with this patient. I recommended he cut back his Mobic 1 tablet a day.  . I also recommended he pick up power step insoles.  He was told to make an appointment in this office 3-4 weeks after wearing the insoles. He is a candidate for  kinetic wedge orthoses  in the future.  RTC prn.   Helane GuntherGregory Kimberlynn Lumbra DPM

## 2017-09-16 ENCOUNTER — Other Ambulatory Visit: Payer: Self-pay

## 2017-09-16 ENCOUNTER — Encounter: Payer: Self-pay | Admitting: Surgery

## 2017-09-16 ENCOUNTER — Ambulatory Visit (INDEPENDENT_AMBULATORY_CARE_PROVIDER_SITE_OTHER): Payer: BLUE CROSS/BLUE SHIELD | Admitting: Surgery

## 2017-09-16 VITALS — BP 119/82 | HR 99 | Temp 97.3°F | Resp 16 | Ht 67.5 in | Wt 187.0 lb

## 2017-09-16 DIAGNOSIS — I70213 Atherosclerosis of native arteries of extremities with intermittent claudication, bilateral legs: Secondary | ICD-10-CM | POA: Diagnosis not present

## 2017-09-16 NOTE — Progress Notes (Signed)
[facility] [place]  Patient name: Joshua Lester MRN: [id number] DOB: 12/01/1955 Sex: male   REASON FOR VISIT:    Follow up  HISOTRY OF PRESENT ILLNESS:    Joshua Lester is a 62 y.o. male, who is here today for evaluation of leg pain.  He states that he has been having pain in his legs for approximately 10 years, however it has gotten worse over the past 3 years.  He has undergone back surgery which help with his sciatic issues.  He now complains of cramping in his legs giving out, inhibiting his ability to walk more than 2-300 feet.  His right leg is slightly worse than the left.  He does not have any open wounds.  He does say he wakes up at night with pain in his feet.    The patient suffers from diabetes.  He takes an ACE inhibitor for hypertension.  He is a former smoker.  He feels that he has had good improvement with the use of cilostazol.  He is trying to be more active.  His symptoms are not lifestyle limiting currently.   PAST MEDICAL HISTORY:   Past Medical History:  Diagnosis Date  . Delayed gastric emptying 2016   per study   . Diabetes mellitus without complication (HCC)   . Diverticulosis   . GERD (gastroesophageal reflux disease)   . Hepatitis C, chronic (HCC) JUL 2016  . Lumbar herniated disc   . Marijuana abuse 01/16/2015     FAMILY HISTORY:   Family History  Problem Relation Age of Onset  . Cancer Mother   . Colon cancer Neg Hx   . Diabetes Neg Hx   . Heart disease Neg Hx   . Stroke Neg Hx   . Hyperlipidemia Neg Hx     SOCIAL HISTORY:   Social History   Tobacco Use  . Smoking status: Former Smoker    Packs/day: 0.50    Years: 40.00    Pack years: 20.00    Types: Cigarettes    Last attempt to quit: 01/13/2012    Years since quitting: 5.6  . Smokeless tobacco: Never Used  Substance Use Topics  . Alcohol use: No    Alcohol/week: 0.0 oz    Comment: no ETOH for 25 years      ALLERGIES:   No Known Allergies   CURRENT MEDICATIONS:   Current Outpatient Medications  Medication Sig Dispense Refill  . aspirin 81 MG chewable tablet Chew by mouth daily.    Marland Kitchen. lisinopril (PRINIVIL,ZESTRIL) 5 MG tablet Take 1 tablet (5 mg total) by mouth daily. 90 tablet 0  . meloxicam (MOBIC) 7.5 MG tablet Take 1 tablet (7.5 mg total) by mouth daily. 30 tablet 0  . metFORMIN (GLUCOPHAGE) 500 MG tablet TAKE 1 TABLET BY MOUTH ONCE DAILY WITH  BREAKFAST. 90 tablet 3  . pantoprazole (PROTONIX) 40 MG tablet Take 1 tablet (40 mg total) by mouth daily. Take 30 minutes prior to breakfast 90 tablet 3  . pravastatin (PRAVACHOL) 20 MG tablet Take 1 tablet (20 mg total) by mouth at bedtime. 90 tablet 3  . tamsulosin (FLOMAX) 0.4 MG CAPS capsule Take 1 capsule (0.4 mg total) by mouth daily. 90 capsule 3   No current facility-administered medications for this visit.     REVIEW OF SYSTEMS:   [X]  denotes positive finding, [ ]  denotes negative finding Cardiac  Comments:  Chest pain or chest pressure:    Shortness of breath upon  exertion: x   Short of breath when lying flat:    Irregular heart rhythm:        Vascular    Pain in calf, thigh, or hip brought on by ambulation: x   Pain in feet at night that wakes you up from your sleep:  x   Blood clot in your veins:    Leg swelling:         Pulmonary    Oxygen at home:    Productive cough:     Wheezing:         Neurologic    Sudden weakness in arms or legs:  x   Sudden numbness in arms or legs:  x   Sudden onset of difficulty speaking or slurred speech:    Temporary loss of vision in one eye:     Problems with dizziness:         Gastrointestinal    Blood in stool:     Vomited blood:         Genitourinary    Burning when urinating:     Blood in urine:        Psychiatric    Major depression:         Hematologic    Bleeding problems:    Problems with blood clotting too easily:        Skin    Rashes or ulcers:         Constitutional    Fever or chills:      PHYSICAL EXAM:   Vitals:   09/16/17 1138  BP: 119/82  Pulse: 99  Resp: 16  Temp: (!) 97.3 F (36.3 C)  TempSrc: Oral  SpO2: 93%  Weight: 187 lb (84.8 kg)  Height: 5' 7.5" (1.715 m)    GENERAL: The patient is a well-nourished male, in no acute distress. The vital signs are documented above. CARDIAC: There is a regular rate and rhythm.  VASCULAR: non-palpable pedal pulses PULMONARY: Non-labored respirations MUSCULOSKELETAL: There are no major deformities or cyanosis. NEUROLOGIC: No focal weakness or paresthesias are detected. SKIN: There are no ulcers or rashes noted. PSYCHIATRIC: The patient has a normal affect.  STUDIES:   none  MEDICAL ISSUES:   Claudication: The patient has had good improvement in his symptoms with the addition of cilostazol.  No intervention is required at this time.  I stressed the importance of a daily exercise program which he is going to work on.  We also discussed the importance of diabetes control.  He will follow-up with his primary care physician regarding these issues.  I have him scheduled for follow-up with me in 1 year with ABIs.  He knows to contact me sooner should he develop worsening symptoms or if he has a nonhealing wound or infection in his leg.    Durene Cal, MD [facility] [phone] Pager [phone]

## 2017-10-03 ENCOUNTER — Other Ambulatory Visit: Payer: Self-pay | Admitting: Medical

## 2017-10-29 ENCOUNTER — Other Ambulatory Visit: Payer: Self-pay | Admitting: Nurse Practitioner

## 2017-10-29 DIAGNOSIS — K7400 Hepatic fibrosis, unspecified: Secondary | ICD-10-CM

## 2017-11-08 ENCOUNTER — Ambulatory Visit
Admission: RE | Admit: 2017-11-08 | Discharge: 2017-11-08 | Disposition: A | Discharge status: Home / Self Care | Payer: BLUE CROSS/BLUE SHIELD | Source: Ambulatory Visit | Attending: Nurse Practitioner | Admitting: Nurse Practitioner

## 2017-11-08 DIAGNOSIS — K7400 Hepatic fibrosis, unspecified: Secondary | ICD-10-CM

## 2017-12-25 ENCOUNTER — Other Ambulatory Visit: Payer: Self-pay | Admitting: Medical

## 2018-01-08 ENCOUNTER — Ambulatory Visit: Payer: BLUE CROSS/BLUE SHIELD | Admitting: Nurse Practitioner

## 2018-01-08 ENCOUNTER — Encounter: Payer: Self-pay | Admitting: Nurse Practitioner

## 2018-03-04 ENCOUNTER — Telehealth: Payer: Self-pay

## 2018-03-04 NOTE — Telephone Encounter (Signed)
error 

## 2018-03-10 ENCOUNTER — Ambulatory Visit: Payer: BLUE CROSS/BLUE SHIELD | Admitting: Medical

## 2018-03-10 ENCOUNTER — Encounter: Payer: Self-pay | Admitting: Medical

## 2018-03-10 VITALS — BP 132/80 | HR 82 | Temp 98.0°F | Resp 16 | Ht 67.0 in | Wt 195.4 lb

## 2018-03-10 DIAGNOSIS — Z9889 Other specified postprocedural states: Secondary | ICD-10-CM | POA: Diagnosis not present

## 2018-03-10 DIAGNOSIS — Z23 Encounter for immunization: Secondary | ICD-10-CM | POA: Diagnosis not present

## 2018-03-10 DIAGNOSIS — I739 Peripheral vascular disease, unspecified: Secondary | ICD-10-CM | POA: Diagnosis not present

## 2018-03-10 DIAGNOSIS — E118 Type 2 diabetes mellitus with unspecified complications: Secondary | ICD-10-CM

## 2018-03-10 DIAGNOSIS — E785 Hyperlipidemia, unspecified: Secondary | ICD-10-CM

## 2018-03-10 DIAGNOSIS — Z8619 Personal history of other infectious and parasitic diseases: Secondary | ICD-10-CM

## 2018-03-10 MED ORDER — CILOSTAZOL 50 MG PO TABS: 50.0000 mg | ORAL_TABLET | Freq: Two times a day (BID) | ORAL | 0 refills | Status: DC

## 2018-03-10 NOTE — Progress Notes (Signed)
Subjective: Chief Complaint  Patient presents with  . med check    med check forms    Here for med check.   Claudication - Been seeing vein specialist about PVD.  Was advised to exercise.  Trying to get some exercise, but legs pains have limited his activity and has had trouble sleeping due to pain.  Can walk about 50 yards and will be in pain.    Diabetes - compliant with Metformin 500mg  daily  Hyperlipidemia - compliant with Pravachol 20mg  daily.  Hep C, chronic, sees liver doctor.   Last visit 10/2017.  Chronic back pain - ongoing pains, limitations in walking.  Wants handicap placard completed today.  No other aggravating or relieving factors. No other complaint.   Past Medical History:  Diagnosis Date  . Delayed gastric emptying 2016   per study   . Diabetes mellitus without complication (HCC)   . Diverticulosis   . GERD (gastroesophageal reflux disease)   . Hepatitis C, chronic (HCC) JUL 2016  . Lumbar herniated disc   . Marijuana abuse 01/16/2015   Current Outpatient Medications on File Prior to Visit  Medication Sig Dispense Refill  . aspirin 81 MG chewable tablet Chew by mouth daily.    Marland Kitchen. latanoprost (XALATAN) 0.005 % ophthalmic solution INSTILL ONE DROP INTO LEFT EYE ONCE DAILY. INSTILL 1 DROP INTO BOTH EYES AT BEDTIME.  99  . lisinopril (PRINIVIL,ZESTRIL) 5 MG tablet TAKE 1 TABLET BY MOUTH ONCE DAILY 90 tablet 0  . meloxicam (MOBIC) 7.5 MG tablet Take 1 tablet (7.5 mg total) by mouth daily. 30 tablet 0  . metFORMIN (GLUCOPHAGE) 500 MG tablet TAKE 1 TABLET BY MOUTH ONCE DAILY WITH  BREAKFAST. 90 tablet 3  . pantoprazole (PROTONIX) 40 MG tablet Take 1 tablet (40 mg total) by mouth daily. Take 30 minutes prior to breakfast 90 tablet 3  . pravastatin (PRAVACHOL) 20 MG tablet Take 1 tablet (20 mg total) by mouth at bedtime. 90 tablet 3  . tamsulosin (FLOMAX) 0.4 MG CAPS capsule Take 1 capsule (0.4 mg total) by mouth daily. (Patient not taking: Reported on 03/10/2018) 90  capsule 3   No current facility-administered medications on file prior to visit.    ROS as in subjective    Objective: BP 132/80   Pulse 82   Temp 98 F (36.7 C) (Oral)   Resp 16   Ht 5\' 7"  (1.702 m)   Wt 195 lb 6.4 oz (88.6 kg)   SpO2 96%   BMI 30.60 kg/m   Wt Readings from Last 3 Encounters:  03/10/18 195 lb 6.4 oz (88.6 kg)  09/16/17 187 lb (84.8 kg)  07/10/17 188 lb (85.3 kg)   General appearance: alert, no distress, WD/WN,  neck: supple, no lymphadenopathy, no thyromegaly, no masses, no bruits Heart: RRR, normal S1, S2, no murmurs Lungs: CTA bilaterally, no wheezes, rhonchi, or rales Abdomen: +bs, soft, non tender, non distended, no masses, no hepatomegaly, no splenomegaly Pulses: 1+ symmetric, upper and lower extremities, normal cap refill Skin: splotchy coloration of right thumb dorsally, nails somewhat thickened bilat hands Ext: no edema    Assessment: Encounter Diagnoses  Name Primary?  . Diabetes mellitus with complication (HCC) Yes  . History of hepatitis C   . Claudication (HCC)   . S/P lumbar discectomy   . Hyperlipidemia, unspecified hyperlipidemia type   . Need for influenza vaccination      Plan: Labs today.  Reviewed 10/2017 hepatitis clinic notes  Wrote script for glucometer  Continue  current medications  Claudication - c/t medications, c/t efforts with walking  He is going to apply for disability due to claudication, chronic back pain, and limitations in daily activities and work  Counseled on the influenza virus vaccine.  Vaccine information sheet given.  Influenza vaccine given after consent obtained.   Sherrill was seen today for med check.  Diagnoses and all orders for this visit:  Diabetes mellitus with complication (HCC) -     Comprehensive metabolic panel -     Hemoglobin A1c  History of hepatitis C -     Comprehensive metabolic panel  Claudication (HCC)  S/P lumbar discectomy  Hyperlipidemia, unspecified  hyperlipidemia type  Need for influenza vaccination  Other orders -     cilostazol (PLETAL) 50 MG tablet; Take 1 tablet (50 mg total) by mouth 2 (two) times daily.

## 2018-03-10 NOTE — Addendum Note (Signed)
Addended by: Derinda LateLAMPART, Tierra Thoma G on: 03/10/2018 02:24 PM   Modules accepted: Orders

## 2018-03-11 ENCOUNTER — Other Ambulatory Visit: Payer: Self-pay | Admitting: Medical

## 2018-03-11 LAB — COMPREHENSIVE METABOLIC PANEL
ALK PHOS: 61 IU/L (ref 39–117)
ALT: 11 IU/L (ref 0–44)
AST: 13 IU/L (ref 0–40)
Albumin/Globulin Ratio: 1.4 (ref 1.2–2.2)
Albumin: 4.3 g/dL (ref 3.6–4.8)
BUN/Creatinine Ratio: 10 (ref 10–24)
BUN: 12 mg/dL (ref 8–27)
Bilirubin Total: 0.4 mg/dL (ref 0.0–1.2)
CALCIUM: 9.4 mg/dL (ref 8.6–10.2)
CO2: 25 mmol/L (ref 20–29)
CREATININE: 1.18 mg/dL (ref 0.76–1.27)
Chloride: 103 mmol/L (ref 96–106)
GFR calc Af Amer: 77 mL/min/{1.73_m2} (ref >59)
GFR calc non Af Amer: 67 mL/min/{1.73_m2} (ref >59)
Globulin, Total: 3.1 g/dL (ref 1.5–4.5)
Glucose: 91 mg/dL (ref 65–99)
Potassium: 4.7 mmol/L (ref 3.5–5.2)
SODIUM: 141 mmol/L (ref 134–144)
Total Protein: 7.4 g/dL (ref 6.0–8.5)

## 2018-03-11 LAB — HEMOGLOBIN A1C
ESTIMATED AVERAGE GLUCOSE: 126 mg/dL
HEMOGLOBIN A1C: 6 % — AB (ref 4.8–5.6)

## 2018-03-11 MED ORDER — ASPIRIN EC 81 MG PO TBEC: 81.0000 mg | DELAYED_RELEASE_TABLET | Freq: Every day | ORAL | 3 refills | Status: AC

## 2018-03-11 MED ORDER — LISINOPRIL 5 MG PO TABS: 5.0000 mg | ORAL_TABLET | Freq: Every day | ORAL | 3 refills | Status: DC

## 2018-03-11 MED ORDER — CILOSTAZOL 50 MG PO TABS: 50.0000 mg | ORAL_TABLET | Freq: Two times a day (BID) | ORAL | 0 refills | Status: DC

## 2018-03-12 ENCOUNTER — Telehealth: Payer: Self-pay | Admitting: Medical

## 2018-03-12 ENCOUNTER — Other Ambulatory Visit: Payer: Self-pay

## 2018-03-12 DIAGNOSIS — E119 Type 2 diabetes mellitus without complications: Secondary | ICD-10-CM

## 2018-03-12 DIAGNOSIS — E118 Type 2 diabetes mellitus with unspecified complications: Secondary | ICD-10-CM

## 2018-03-12 DIAGNOSIS — Z8619 Personal history of other infectious and parasitic diseases: Secondary | ICD-10-CM

## 2018-03-12 DIAGNOSIS — E785 Hyperlipidemia, unspecified: Secondary | ICD-10-CM

## 2018-03-12 NOTE — Telephone Encounter (Signed)
Called [facility] and pt has never been seen at that practice. I called the pt and he is unsure of where he went. Pt states he is open to seeing a cardiologist if Vincenza HewsShane thinks its necessary.

## 2018-03-12 NOTE — Telephone Encounter (Signed)
Referral ordered

## 2018-03-12 NOTE — Telephone Encounter (Signed)
Yes, please refer, [facility] unless appt > 2 months from now

## 2018-03-20 ENCOUNTER — Telehealth: Payer: Self-pay

## 2018-03-20 NOTE — Telephone Encounter (Signed)
Patient notified of Cardiology appointment.  03-24-18 at 230 on [facility].

## 2018-03-24 ENCOUNTER — Ambulatory Visit: Payer: BLUE CROSS/BLUE SHIELD | Admitting: Cardiology

## 2018-04-03 ENCOUNTER — Ambulatory Visit: Payer: BLUE CROSS/BLUE SHIELD | Admitting: Cardiology

## 2018-04-03 ENCOUNTER — Encounter: Payer: Self-pay | Admitting: Cardiology

## 2018-04-03 ENCOUNTER — Encounter: Payer: Self-pay | Admitting: *Deleted

## 2018-04-03 VITALS — BP 110/80 | HR 79 | Ht 67.0 in | Wt 192.0 lb

## 2018-04-03 DIAGNOSIS — E118 Type 2 diabetes mellitus with unspecified complications: Secondary | ICD-10-CM | POA: Diagnosis not present

## 2018-04-03 DIAGNOSIS — I10 Essential (primary) hypertension: Secondary | ICD-10-CM

## 2018-04-03 DIAGNOSIS — R072 Precordial pain: Secondary | ICD-10-CM

## 2018-04-03 DIAGNOSIS — E785 Hyperlipidemia, unspecified: Secondary | ICD-10-CM

## 2018-04-03 DIAGNOSIS — I739 Peripheral vascular disease, unspecified: Secondary | ICD-10-CM | POA: Diagnosis not present

## 2018-04-03 DIAGNOSIS — R0789 Other chest pain: Secondary | ICD-10-CM

## 2018-04-03 MED ORDER — ATORVASTATIN CALCIUM 40 MG PO TABS: 40.0000 mg | ORAL_TABLET | Freq: Every day | ORAL | 3 refills | Status: DC
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
## 2018-04-03 NOTE — Patient Instructions (Signed)
Medication Instructions:   STOP TAKING PRAVASTATIN NOW  START TAKING ATORVASTATIN 40 MG ONCE DAILY  If you need a refill on your cardiac medications before your next appointment, please call your pharmacy.     Lab work:  IN 2 MONTHS TO CHECK--LIPIDS AND ALT---PLEASE COME FASTING TO THIS LAB APPOINTMENT  If you have labs (blood work) drawn today and your tests are completely normal, you will receive your results only by: Marland Kitchen MyChart Message (if you have MyChart) OR . A paper copy in the mail If you have any lab test that is abnormal or we need to change your treatment, we will call you to review the results.     Testing/Procedures:  Your physician has requested that you have a lexiscan myoview. For further information please visit [email or web address] Please follow instruction sheet, as given.     Follow-Up: At [facility], you and your health needs are our priority.  As part of our continuing mission to provide you with exceptional heart care, we have created designated Provider Care Teams.  These Care Teams include your primary Cardiologist (physician) and Advanced Practice Providers (APPs -  Physician Assistants and Nurse Practitioners) who all work together to provide you with the care you need, when you need it. You will need a follow up appointment in AS NEEDED WITH DR. Anne Fu .   Practice Providers on your designated Care Team:   Norma Fredrickson, NP Nada Boozer, NP . Georgie Chard, NP

## 2018-04-10 ENCOUNTER — Telehealth (HOSPITAL_COMMUNITY): Payer: Self-pay | Admitting: *Deleted

## 2018-04-10 NOTE — Telephone Encounter (Signed)
Patient given detailed instructions per Myocardial Perfusion Study Information Sheet for the test on 04/15/18 Patient notified to arrive 15 minutes early and that it is imperative to arrive on time for appointment to keep from having the test rescheduled.  If you need to cancel or reschedule your appointment, please call the office within 24 hours of your appointment. . Patient verbalized understanding. Alajiah Dutkiewicz Jacqueline    

## 2018-04-15 ENCOUNTER — Ambulatory Visit (HOSPITAL_COMMUNITY): Payer: BLUE CROSS/BLUE SHIELD | Attending: Cardiology

## 2018-04-15 DIAGNOSIS — R0789 Other chest pain: Secondary | ICD-10-CM | POA: Diagnosis present

## 2018-04-15 DIAGNOSIS — R072 Precordial pain: Secondary | ICD-10-CM | POA: Diagnosis not present

## 2018-04-15 DIAGNOSIS — E785 Hyperlipidemia, unspecified: Secondary | ICD-10-CM | POA: Diagnosis present

## 2018-04-15 DIAGNOSIS — E118 Type 2 diabetes mellitus with unspecified complications: Secondary | ICD-10-CM | POA: Diagnosis not present

## 2018-04-15 DIAGNOSIS — I10 Essential (primary) hypertension: Secondary | ICD-10-CM | POA: Insufficient documentation

## 2018-04-15 DIAGNOSIS — I739 Peripheral vascular disease, unspecified: Secondary | ICD-10-CM | POA: Insufficient documentation

## 2018-04-15 LAB — MYOCARDIAL PERFUSION IMAGING
CHL CUP RESTING HR STRESS: 85 {beats}/min
LVDIAVOL: 66 mL (ref 62–150)
LVSYSVOL: 28 mL
Peak HR: 115 {beats}/min
SDS: 1
SRS: 0
SSS: 1
TID: 1.12

## 2018-04-15 MED ORDER — REGADENOSON 0.4 MG/5ML IV SOLN
0.4000 mg | Freq: Once | INTRAVENOUS | Status: AC
  Administered 2018-04-15: 0.4 mg via INTRAVENOUS

## 2018-04-15 MED ORDER — TECHNETIUM TC 99M TETROFOSMIN IV KIT
30.9000 | PACK | Freq: Once | INTRAVENOUS | Status: AC | PRN
  Administered 2018-04-15: 30.9 via INTRAVENOUS
  Filled 2018-04-15: qty 31

## 2018-04-15 MED ORDER — TECHNETIUM TC 99M TETROFOSMIN IV KIT
10.6000 | PACK | Freq: Once | INTRAVENOUS | Status: AC | PRN
  Administered 2018-04-15: 10.6 via INTRAVENOUS
  Filled 2018-04-15: qty 11

## 2018-05-14 ENCOUNTER — Other Ambulatory Visit: Payer: Self-pay | Admitting: Nurse Practitioner

## 2018-05-14 DIAGNOSIS — K7469 Other cirrhosis of liver: Secondary | ICD-10-CM

## 2018-05-19 ENCOUNTER — Ambulatory Visit
Admission: RE | Admit: 2018-05-19 | Discharge: 2018-05-19 | Disposition: A | Discharge status: Home / Self Care | Payer: BLUE CROSS/BLUE SHIELD | Source: Ambulatory Visit | Attending: Nurse Practitioner | Admitting: Nurse Practitioner

## 2018-05-19 DIAGNOSIS — K7469 Other cirrhosis of liver: Secondary | ICD-10-CM

## 2018-05-26 DIAGNOSIS — Z0289 Encounter for other administrative examinations: Secondary | ICD-10-CM

## 2018-06-05 ENCOUNTER — Other Ambulatory Visit: Payer: BLUE CROSS/BLUE SHIELD

## 2018-06-05 DIAGNOSIS — R072 Precordial pain: Secondary | ICD-10-CM

## 2018-06-05 DIAGNOSIS — I10 Essential (primary) hypertension: Secondary | ICD-10-CM

## 2018-06-05 DIAGNOSIS — E118 Type 2 diabetes mellitus with unspecified complications: Secondary | ICD-10-CM

## 2018-06-05 DIAGNOSIS — I739 Peripheral vascular disease, unspecified: Secondary | ICD-10-CM

## 2018-06-05 DIAGNOSIS — R0789 Other chest pain: Secondary | ICD-10-CM

## 2018-06-05 DIAGNOSIS — E785 Hyperlipidemia, unspecified: Secondary | ICD-10-CM

## 2018-06-06 LAB — LIPID PANEL
CHOL/HDL RATIO: 2.1 ratio (ref 0.0–5.0)
Cholesterol, Total: 66 mg/dL — ABNORMAL LOW (ref 100–199)
HDL: 32 mg/dL — ABNORMAL LOW (ref >39)
LDL Calculated: 14 mg/dL (ref 0–99)
Triglycerides: 98 mg/dL (ref 0–149)
VLDL CHOLESTEROL CAL: 20 mg/dL (ref 5–40)

## 2018-06-06 LAB — ALT: ALT: 14 IU/L (ref 0–44)

## 2018-07-16 ENCOUNTER — Other Ambulatory Visit: Payer: Self-pay | Admitting: Medical

## 2018-07-22 ENCOUNTER — Other Ambulatory Visit: Payer: Self-pay | Admitting: Medical

## 2018-07-23 NOTE — Telephone Encounter (Signed)
Refill and get in for fasting diabetes med check now

## 2018-07-23 NOTE — Telephone Encounter (Signed)
Is this ok to refill?  

## 2018-08-22 ENCOUNTER — Other Ambulatory Visit: Payer: Self-pay | Admitting: Medical

## 2018-09-12 ENCOUNTER — Other Ambulatory Visit: Payer: Self-pay | Admitting: Medical

## 2018-09-12 NOTE — Telephone Encounter (Signed)
Make f/u appt but send refills as is

## 2018-09-12 NOTE — Telephone Encounter (Signed)
Is these ok to refill?

## 2018-09-12 NOTE — Telephone Encounter (Signed)
Is this ok to refill?  

## 2018-09-17 NOTE — Telephone Encounter (Signed)
Please address RX refills

## 2018-09-17 NOTE — Telephone Encounter (Signed)
See message to [facility] in the message details.  I already addressed this but had not heard back from [facility].  I advised refill as long as he makes f/u.  At this point, set him up virtual encounter preferably Thursday morning, WebEx preferably or telephone.

## 2018-09-18 ENCOUNTER — Ambulatory Visit (INDEPENDENT_AMBULATORY_CARE_PROVIDER_SITE_OTHER): Payer: BLUE CROSS/BLUE SHIELD | Admitting: Medical

## 2018-09-18 ENCOUNTER — Other Ambulatory Visit: Payer: Self-pay

## 2018-09-18 ENCOUNTER — Encounter: Payer: Self-pay | Admitting: Medical

## 2018-09-18 VITALS — Ht 67.5 in | Wt 190.0 lb

## 2018-09-18 DIAGNOSIS — E118 Type 2 diabetes mellitus with unspecified complications: Secondary | ICD-10-CM

## 2018-09-18 DIAGNOSIS — I739 Peripheral vascular disease, unspecified: Secondary | ICD-10-CM | POA: Diagnosis not present

## 2018-09-18 DIAGNOSIS — Z7185 Encounter for immunization safety counseling: Secondary | ICD-10-CM

## 2018-09-18 DIAGNOSIS — E785 Hyperlipidemia, unspecified: Secondary | ICD-10-CM

## 2018-09-18 DIAGNOSIS — Z7189 Other specified counseling: Secondary | ICD-10-CM | POA: Diagnosis not present

## 2018-09-18 DIAGNOSIS — Z23 Encounter for immunization: Secondary | ICD-10-CM

## 2018-09-18 MED ORDER — CILOSTAZOL 50 MG PO TABS: 50.0000 mg | ORAL_TABLET | Freq: Two times a day (BID) | ORAL | 1 refills | Status: DC

## 2018-09-18 MED ORDER — PANTOPRAZOLE SODIUM 40 MG PO TBEC: DELAYED_RELEASE_TABLET | ORAL | 3 refills | Status: AC

## 2018-09-18 MED ORDER — METFORMIN HCL 500 MG PO TABS: 500.0000 mg | ORAL_TABLET | Freq: Every day | ORAL | 3 refills | Status: AC

## 2018-09-18 MED ORDER — TAMSULOSIN HCL 0.4 MG PO CAPS: 0.4000 mg | ORAL_CAPSULE | Freq: Every day | ORAL | 3 refills | Status: AC

## 2018-09-18 NOTE — Addendum Note (Signed)
Addended by: Jac Canavan on: 09/18/2018 01:00 PM   Modules accepted: Orders

## 2018-09-18 NOTE — Telephone Encounter (Signed)
Made office visit for 11 this morning

## 2018-09-18 NOTE — Progress Notes (Signed)
Subjective: Chief Complaint  Patient presents with  . med check    med check refill meds  sugar running good.    Documentation for Virtual telephone  encounter:  This virtual service is not related to other E/M service within previous 7 days.  Patient consented to the consult.  This virtual consult involved the patient and myself, Catering manager PA-C.  Patient Care Team: , Kermit Balo, PA-C as PCP - General (Family Medicine) West Bali, MD as Consulting Physician (Gastroenterology) Jake Bathe, MD as Consulting Physician (Cardiology) Zamor, Harlan Stains, MD as Attending Physician (Internal Medicine) Annamarie Major, CRNP as Nurse Practitioner (Nurse Practitioner) Nada Libman, MD as Consulting Physician (Vascular Surgery) Helane Gunther, DPM as Consulting Physician (Podiatry) Eldred Manges, MD as Consulting Physician (Orthopedic Surgery)  Mr. Madilyn Fireman has a medical history including diabetes, history of herniated disc and chronic back pain, chronic hepatitis C, liver fibrosis, hyperlipidemia, claudication, peripheral vascular disease, delayed gastric emptying,  Diabetes- compliant with metformin 500 mg once daily.  He does not check his blood sugars.  He denies any foot concerns.  No polydipsia, no polyuria, no recent weight change.  He is trying to eat healthy and exercise with swimming at the [facility] before it close down due to the recent coronavirus scare.  No concern currently  Renal protection-compliant with lisinopril 5 mg daily, no complaint  Hyperlipidemia-compliant with atorvastatin 40 mg which was changed by cardiology back in the fall  Feeling ok in general, no complaint  Past Medical History:  Diagnosis Date  . Delayed gastric emptying 2016   per study   . Diabetes mellitus without complication (HCC)   . Diverticulosis   . GERD (gastroesophageal reflux disease)   . Hepatitis C, chronic (HCC) JUL 2016  . Lumbar herniated disc   . Marijuana abuse  01/16/2015   Current Outpatient Medications on File Prior to Visit  Medication Sig Dispense Refill  . aspirin EC 81 MG tablet Take 1 tablet (81 mg total) by mouth daily. 90 tablet 3  . atorvastatin (LIPITOR) 40 MG tablet Take 1 tablet (40 mg total) by mouth daily. 90 tablet 3  . cilostazol (PLETAL) 50 MG tablet TAKE 1 TABLET BY MOUTH TWICE DAILY 180 tablet 0  . latanoprost (XALATAN) 0.005 % ophthalmic solution INSTILL ONE DROP INTO LEFT EYE ONCE DAILY. INSTILL 1 DROP INTO BOTH EYES AT BEDTIME.  99  . lisinopril (PRINIVIL,ZESTRIL) 5 MG tablet Take 1 tablet (5 mg total) by mouth daily. 90 tablet 3  . meloxicam (MOBIC) 7.5 MG tablet Take 1 tablet (7.5 mg total) by mouth daily. 30 tablet 0  . metFORMIN (GLUCOPHAGE) 500 MG tablet Take 1 tablet by mouth once daily with breakfast 90 tablet 0  . pantoprazole (PROTONIX) 40 MG tablet TAKE 1 TABLET BY MOUTH ONCE DAILY. TAKE PRIOR TO BREAKFAST. 90 tablet 0  . tamsulosin (FLOMAX) 0.4 MG CAPS capsule TAKE 1 CAPSULE BY MOUTH ONCE DAILY 90 capsule 0   No current facility-administered medications on file prior to visit.    ROS as in subjective    Objective: Ht 5' 7.5" (1.715 m)   Wt 190 lb (86.2 kg)   BMI 29.32 kg/m  reported by patient  Wt Readings from Last 3 Encounters:  09/18/18 190 lb (86.2 kg)  04/15/18 192 lb (87.1 kg)  04/03/18 192 lb (87.1 kg)    On the phone he answers questions clearly, no obvious distress.    Assessment: Encounter Diagnoses  Name Primary?  Marland Kitchen  Diabetes mellitus with complication (HCC) Yes  . Claudication (HCC)   . Hyperlipidemia, unspecified hyperlipidemia type   . Vaccine counseling   . Need for shingles vaccine   . Need for pneumococcal vaccination   . Peripheral vascular disease of extremity (HCC)      Plan: We discussed his medications, and we discussed the last labs in the chart  He will stay time to come by for lab visit only to minimize exposure to the public.  He will come in for  hemoglobin A1c and micro albumin lab.  He will also get pneumococcal 23 and Shingrix No. 1 at that visit  Recommendations today Advise he see his eye doctor and dentist yearly Check feet daily for sores or wounds that would prompt our attention Consider glucose monitoring Continue current medications Try to get exercise even outside now that the [facility] is closed for the time being  Reviewed his medications and continue current medications for diabetes, cholesterol, pletal for peripheral vascular disease  Continue routine follow-up with liver clinic as well as vascular disease follow-up  I wrote a prescription for glucometer and testing supplies today  I reviewed recent consult notes in the chart as below  Liver fibrosis, history of chronic hepatitis C-I reviewed his 05/13/2018 consult with liver care.  At that visit he has elastography F2-F3, fibro-test F4 liver fibrosis.  There is a?  Of cirrhosis versus hepatic steatosis.  His risk factors for fatty liver disease include obesity hyperglycemia.  At that visit he was scheduled to do an updated right upper quadrant ultrasound and plan to have ultrasound every 6 months to screen for liver cancer.  He had EGD in 2016 for variceal screening.  At that time he was not recommended to have additional variceal screening by EGD.  I reviewed his cardiology notes from October 2019 with Dr. Anne Fu.  His pravastatin was changed to atorvastatin due to peripheral vascular disease.  He was sent for pharmacological stress test which showed ejection fraction 50%, left ventricle systolic function normal, no ST segment deviation during stress test, low risk study  I reviewed his labs from December and September 2019 including comprehensive metabolic panel, hemoglobin A1c at goal, lipid panel at goal, and reviewed PSA from January 2019   Time spent with direct patient care and communication 

## 2018-09-18 NOTE — Patient Instructions (Signed)
Recommendations:  Please call back to schedule a nurse visit within the next week for hemoglobin A1c and microalbumin lab  When you come for that visit we will plan to do Shingrix No.  1 vaccine as well as pneumococcal 23 vaccine   Type 2 Diabetes  Diabetes is a long-lasting (chronic) disease.  With diabetes, either the pancreas does not make enough of a hormone called insulin, or the body has trouble using the insulin that is made.  Over time, diabetes can damage the eyes, kidneys, and nerves causing retinopathy, nephropathy, and neuropathy.  Diabetes puts you at risk for heart disease and peripheral vascular disease which can lead to heart attack, stroke, foot ulcers, and amputations.    Our goal and hopefully your goal is to manage your diabetes in such a way to slow the progression of the disease and do all we can to keep you healthy  Home Care:   Eat healthy, exercise regularly, limit alcohol, and don't smoke!  Check your blood sugar (glucose) once a day before breakfast, or as indicated by our discussion today.  Take your medications daily, don't run out of medications.  Learn about low blood sugar (hypoglycemia). Know how to treat it.  Wear a necklace or bracelet that says you have diabetes.  Check your feet every night for cuts, sores, blisters, and redness. Tell your medical provider if you have problems.  Maintain a normal body weight, or normal BMI - height to weight ratio of 20-25.  Ask me about this.  GET HELP RIGHT AWAY IF:  You have trouble keeping your blood sugar in target range.  You have problems with your medicines.  You are sick and not getting better after 24 hours.  You have a sore or wound that is not healing.  You have vision problems or changes.  You have a fever.  Diet: American Diabetes Association diet, see their website, or consider [facility] Diet  Exercise regularly since it has beneficial effects on the heart and blood sugars. Exercising  at least three times per week or 150 minutes per week can be as important as medication to a diabetic.  Find some form of exercise that you will enjoy doing regularly.  This can include walking, biking, kayaking, golfing, swimming, dance, aerobics, hiking, etc.  If you have joint problems, many local gyms have equipment to accommodate people with specific needs.    Vaccinations:  Diabetics are at increased risk for infection, and illnesses can take longer to resolve.  Current vaccine recommendations include yearly Influenza (flu) vaccine (recommended in October), Pneumococcal vaccine, Hepatitis B vaccine series, Tdap (tetanus, diptheria and pertussis) vaccine every 10 years, and other age appropriate vaccinations.     Office visits:  We recommend routine medical care to make sure we are addressing prevention and issues as they arise.  Typically this could mean twice yearly or up to quarterly depending upon your unique health situation.  Exams should include a yearly physical, a yearly foot exam, and other examination as appropriate.  You should see an eye doctor yearly to help screen for and prevent blood vessel complications in your eyes.  Labs: Diabetics should have blood work done at least twice yearly to monitor your Hemoglobin A1C (a three-month average of your blood sugars) and your cholesterol.  You should have your urine and blood checked yearly to screen for kidney damage.  This may include creatinine and micro-albumin levels.  Other labs as appropriate.    Blood pressure goals:  Goal blood pressure in diabetics should be 130/80 or less. Monitoring your blood pressure with a home blood pressure cuff of your own is an excellent idea.  If you are prescribed medication for blood pressure, take your medication every day, and don't run out of medication.  Having high blood pressure can damage your heart, eyes, kidneys, and put you at risk for heart attack and stroke.  Tobacco use:  If you smoke, dip or  chew, quitting will reduce your risk of heart attack, stroke, peripheral vascular disease, and many cancers.    Diabetic Report Card for Joshua Lester  September 18, 2018  Below is a summary of recent tests related to your diabetes that can help you manage your health.   Hemoglobin A1C:  Your Hemoglobin A1C values should be less than 7. If these are greater than 7, you have a higher chance of having eye, heart, and kidney problems in the future.   Your most recent Hemoglobin A1C values were:  Hemoglobin A1C (no units)  Date Value  06/27/2017 6.2%  01/03/2017 7.0%  12/05/2016 7.6%   Hgb A1c MFr Bld (%)  Date Value  03/10/2018 6.0 (H)  06/22/2016 7.1 (H)  01/13/2015 6.5 (H)     Cholesterol:  Your LDL Cholesterol (bad cholesterol) values should be less than 100 mg/dL if you do not have cardiovascular disease.  The LDL should be less than 70 mg/dL if you do have cardiovascular disease.  If your LDL is consistently higher than 100 mg/dL, then your risk of heart attack and stroke increases yearly.   Your most recent LDL Cholesterol (bad cholesterol) results were:  LDL Cholesterol (Calc) (mg/dL (calc))  Date Value  77/41/4239 55   LDL Calculated (mg/dL)  Date Value  53/20/2334 14   LDL Cholesterol (mg/dL)  Date Value  35/68/6168 80     Your HDL Cholesterol (good cholesterol) values should be higher than 40 mg/dL.  If your HDL is lower than 40 mg/dL, this increases your risk of heart attack and stroke.      Blood Pressure:  Your blood pressure values should be less than 130/80. Please contact me if your readings at home are consistently higher than this.   Your most recent blood pressure readings at our clinic were:  BP Readings from Last 3 Encounters:  04/03/18 110/80  03/10/18 132/80  09/16/17 119/82    Urine Protein: Having an elevated microalbumin to creatinine ratio is a marker for early kidney damage due to diabetes or high blood pressure.

## 2019-01-28 IMAGING — MR MR LUMBAR SPINE WO/W CM
7 series · 45 of 48 positions shown · IV contrast (multihance)
Comparison: MRI dated 11/28/2014 and radiographs dated 11/14/2016

CLINICAL DATA: Chronic low back pain and bilateral leg pain. Lumbar
radiculopathy.

EXAM:
MRI LUMBAR SPINE WITHOUT AND WITH CONTRAST
TECHNIQUE: Multiplanar and multiecho pulse sequences of the lumbar spine were
obtained without and with intravenous contrast.
CONTRAST:  19mL MULTIHANCE GADOBENATE DIMEGLUMINE 529 MG/ML IV SOLN

[Series 3: T1 · sagittal · 4.0mm · 0.94mm/px · 5 of 13 slices shown (1 of 2)]
[im 1/13]
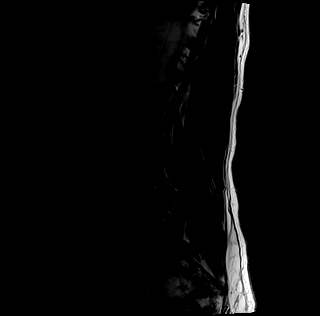
[im 4/13]
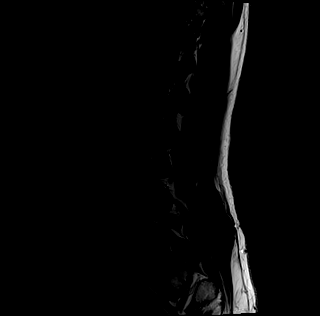
[im 7/13]
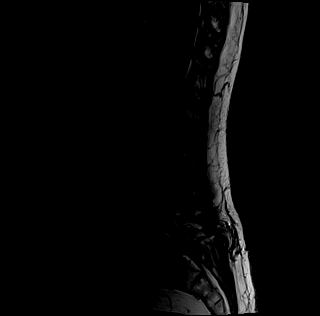
[im 10/13]
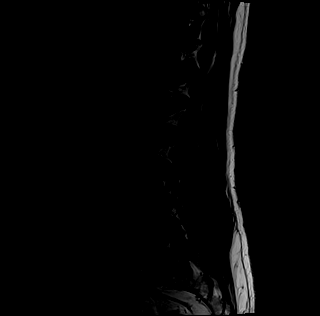
[im 13/13]
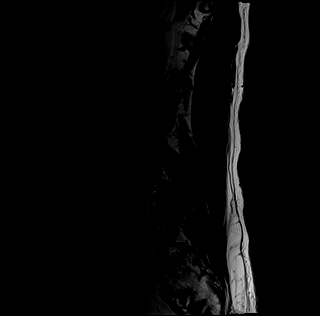

[Series 4: tirm sag · sagittal · 4.0mm · 0.59mm/px · 5 of 13 slices shown]
[im 1/13]
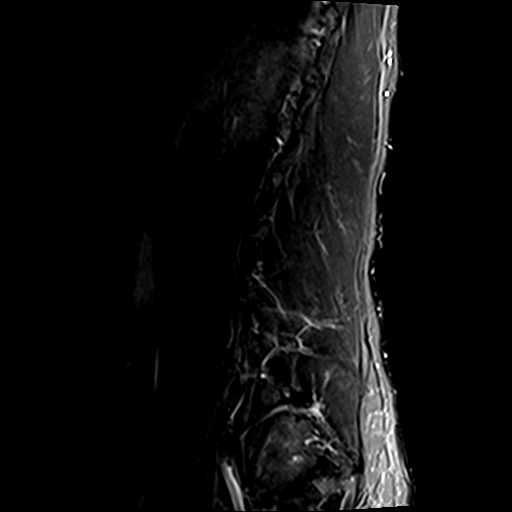
[im 4/13]
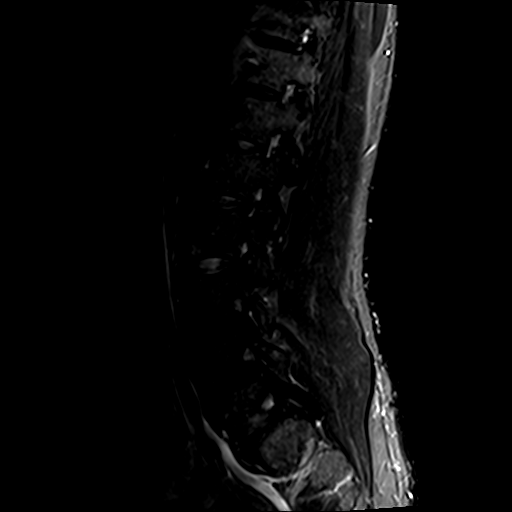
[im 7/13]
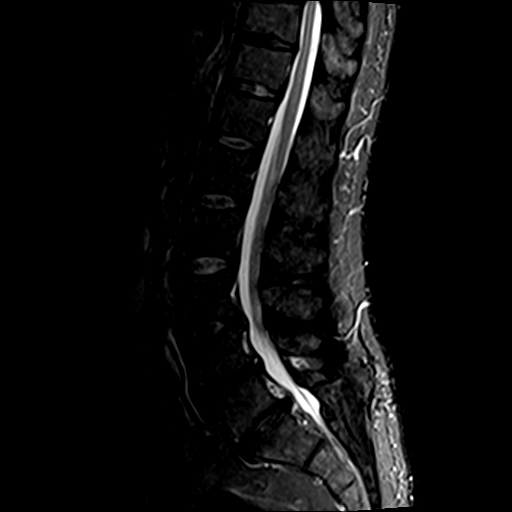
[im 10/13]
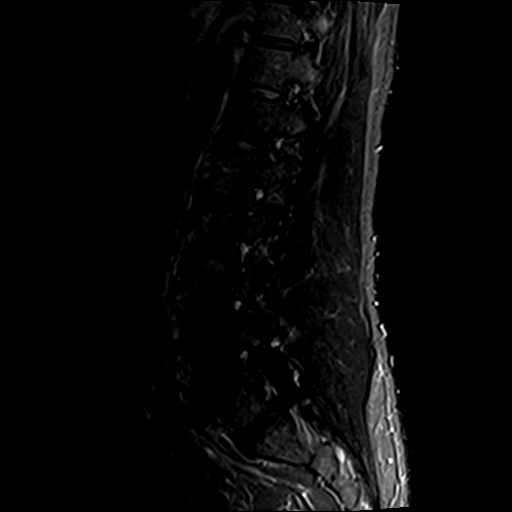
[im 13/13]
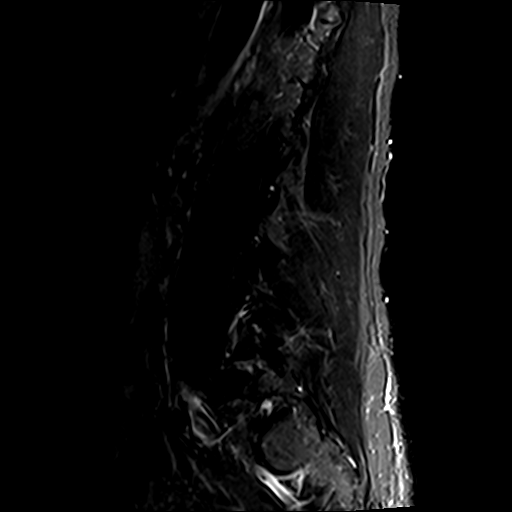

[Series 5: T1 · axial · 4.0mm · 0.78mm/px · z∈[-120,+72]mm · 9 of 32 slices shown (2 of 2)]
[im 1/32]
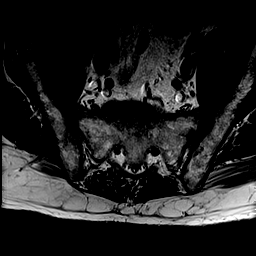
[im 4/32]
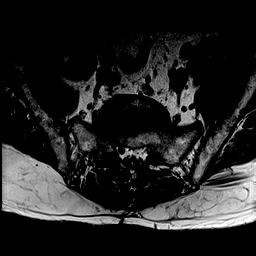
[im 7/32]
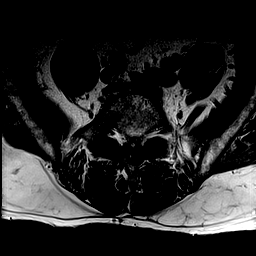
[im 11/32]
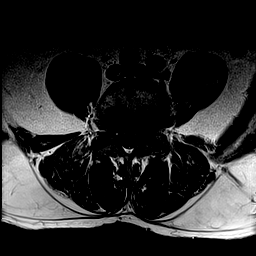
[im 14/32]
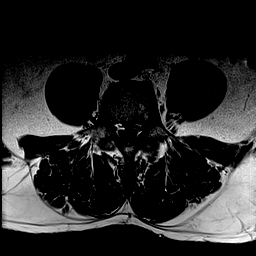
[im 18/32]
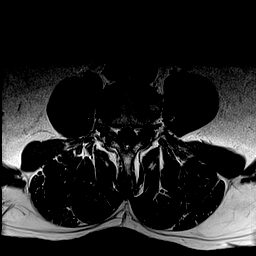
[im 21/32]
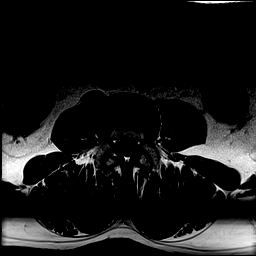
[im 28/32]
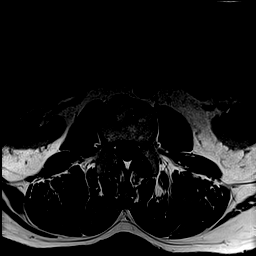
[im 32/32]
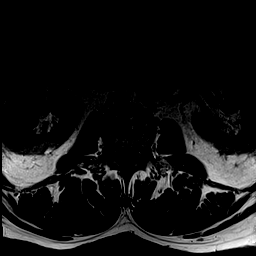

[Series 6: T2 · axial · 4.0mm · 0.78mm/px · z∈[-120,+72]mm · 10 of 32 slices shown (1 of 2)]
[im 1/32]
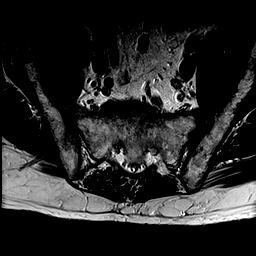
[im 4/32]
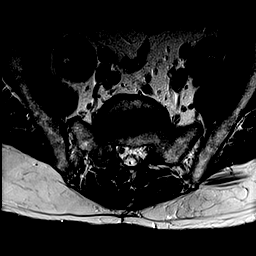
[im 7/32]
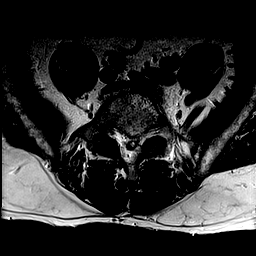
[im 11/32]
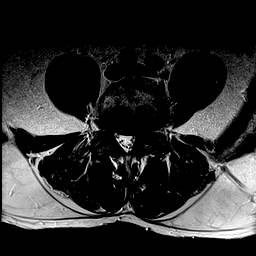
[im 14/32]
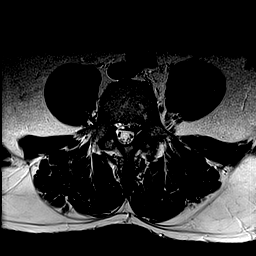
[im 18/32]
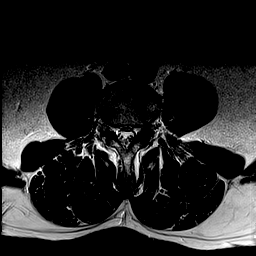
[im 21/32]
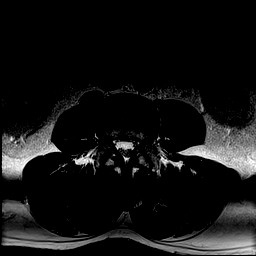
[im 25/32]
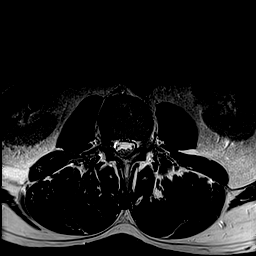
[im 28/32]
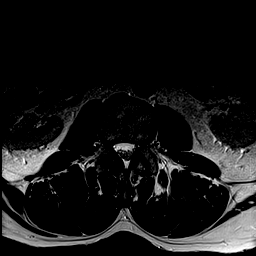
[im 32/32]
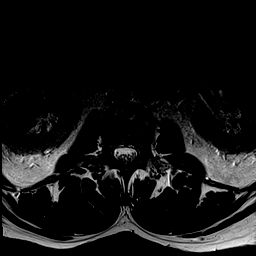

[Series 7: T2 · sagittal · 4.0mm · 0.94mm/px · 4 of 13 slices shown (2 of 2)]
[im 1/13]
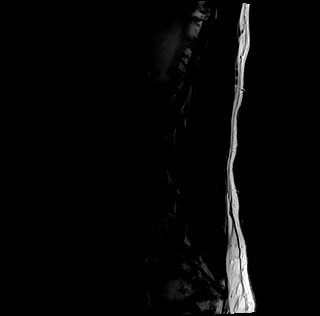
[im 5/13]
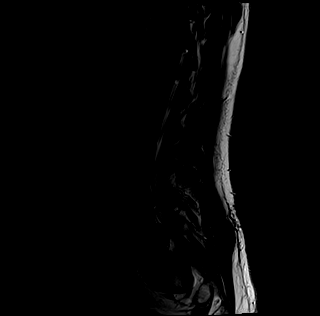
[im 9/13]
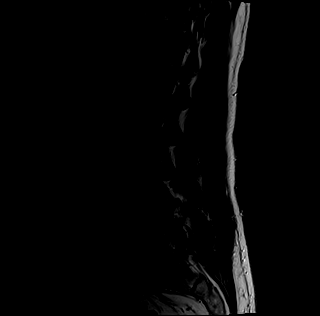
[im 13/13]
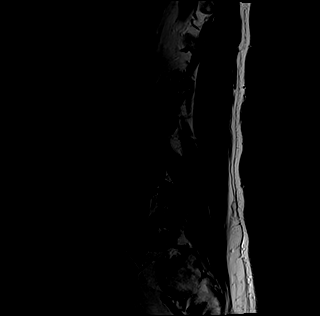

[Series 8: T1 fat-sat post-contrast · sagittal · 4.0mm · 0.88mm/px · 4 of 13 slices shown]
[im 1/13]
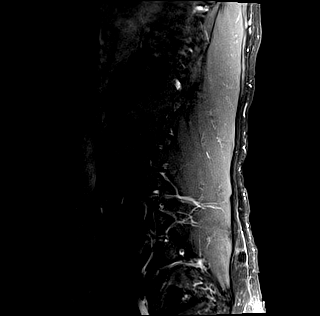
[im 5/13]
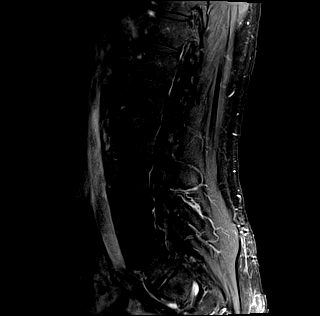
[im 9/13]
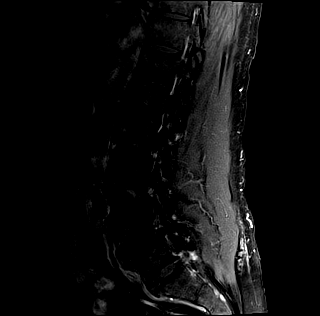
[im 13/13]
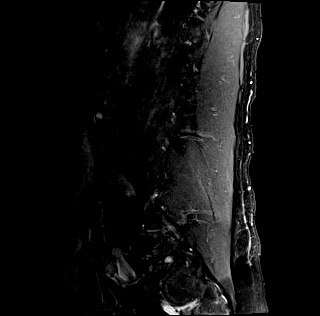

[Series 9: T1 post-contrast · axial · 4.0mm · 0.78mm/px · z∈[-120,+72]mm · 8 of 32 slices shown]
[im 1/32]
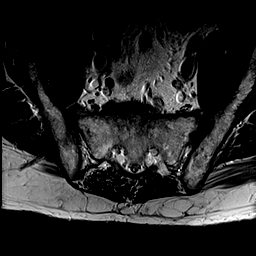
[im 4/32]
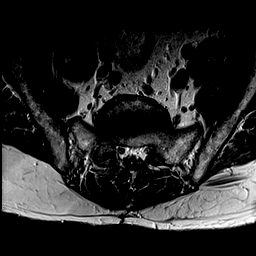
[im 11/32]
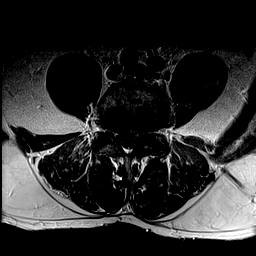
[im 14/32]
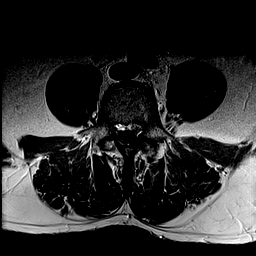
[im 18/32]
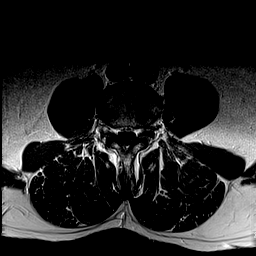
[im 21/32]
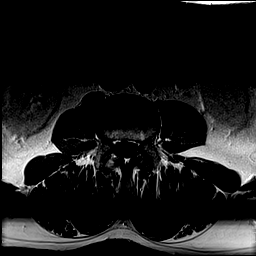
[im 28/32]
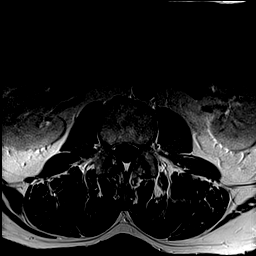
[im 32/32]
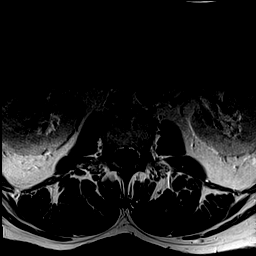

[45 of 48 positions shown; findings below may reference images not displayed]

FINDINGS: Segmentation:  Standard.

Alignment:  Physiologic.

Vertebrae:  No fracture, evidence of discitis, or bone lesion.

Conus medullaris: Extends to the L1 level and appears normal.

Paraspinal and other soft tissues: Negative

Disc levels:

T11-12 through L2-3:  Negative.

L3-4: Small progressive broad-based disc bulge without neural
impingement. No severe foraminal stenosis.

L4-5: Broad-based disc protrusion with a central annular fissure
compressing the thecal sac and both lateral recesses, left more than
right, slightly progressed since the prior study. This could affect
the left L5 nerve. Moderate left facet arthritis.

L5-S1: Interval surgery with posterior decompression. Enhancing scar
tissue around the thecal sac and nerve root sleeves to the expected
degree. There is a small central disc bulge and scarring without
neural impingement. Moderate right facet arthritis. Incidental note
is made of a lipoma of the filum terminale.
IMPRESSION: 1. Progressive broad-based disc protrusion at L4-5 with further
compression of the left lateral recess which could affect the left
L5 nerve.
2. Moderate left facet arthritis at L4-5 and moderate right facet
arthritis at L5-S1
3. Scarring around the thecal sac and nerve roots to the expected
degree at L5-S1 with a small residual or recurrent central disc
bulge without neural impingement.

## 2019-04-02 ENCOUNTER — Other Ambulatory Visit: Payer: Self-pay | Admitting: Cardiology

## 2019-04-02 ENCOUNTER — Other Ambulatory Visit: Payer: Self-pay | Admitting: Medical

## 2019-04-02 DIAGNOSIS — R072 Precordial pain: Secondary | ICD-10-CM

## 2019-04-02 DIAGNOSIS — I10 Essential (primary) hypertension: Secondary | ICD-10-CM

## 2019-04-02 DIAGNOSIS — E785 Hyperlipidemia, unspecified: Secondary | ICD-10-CM

## 2019-04-02 DIAGNOSIS — E118 Type 2 diabetes mellitus with unspecified complications: Secondary | ICD-10-CM

## 2019-04-02 DIAGNOSIS — R0789 Other chest pain: Secondary | ICD-10-CM

## 2019-04-02 DIAGNOSIS — I739 Peripheral vascular disease, unspecified: Secondary | ICD-10-CM

## 2019-11-09 ENCOUNTER — Other Ambulatory Visit: Payer: Self-pay | Admitting: Medical

## 2019-11-09 NOTE — Telephone Encounter (Signed)
Decline refill  It looks like he is transferred to a different PCP in the [facility] network

## 2019-11-09 NOTE — Telephone Encounter (Signed)
Is this ok to refill flomax pt. Last apt was 09/18/18 and has no future apt. You are also not listed as the pts. PCP.

## 2022-11-11 ENCOUNTER — Other Ambulatory Visit: Payer: Self-pay

## 2022-11-11 ENCOUNTER — Emergency Department (HOSPITAL_COMMUNITY)
Admission: EM | Admit: 2022-11-11 | Discharge: 2022-11-11 | Disposition: A | Discharge status: Home / Self Care | Payer: Medicaid Other | Attending: Emergency Medicine | Admitting: Emergency Medicine

## 2022-11-11 DIAGNOSIS — W57XXXA Bitten or stung by nonvenomous insect and other nonvenomous arthropods, initial encounter: Secondary | ICD-10-CM | POA: Diagnosis not present

## 2022-11-11 DIAGNOSIS — S20462A Insect bite (nonvenomous) of left back wall of thorax, initial encounter: Secondary | ICD-10-CM | POA: Diagnosis present

## 2022-11-11 DIAGNOSIS — Z7982 Long term (current) use of aspirin: Secondary | ICD-10-CM | POA: Insufficient documentation

## 2022-11-11 MED ORDER — DOXYCYCLINE HYCLATE 100 MG PO TABS
100.0000 mg | ORAL_TABLET | Freq: Once | ORAL | Status: AC
  Administered 2022-11-11: 100 mg via ORAL
  Filled 2022-11-11: qty 1

## 2022-11-11 MED ORDER — DOXYCYCLINE HYCLATE 100 MG PO CAPS: 100.0000 mg | ORAL_CAPSULE | Freq: Two times a day (BID) | ORAL | 0 refills | Status: AC

## 2022-11-11 NOTE — ED Provider Notes (Signed)
[place] EMERGENCY DEPARTMENT AT [facility] Provider Note   CSN: [id number] Arrival date & time: 11/11/22  2043     History  Chief Complaint  Patient presents with   Insect Bite    Joshua Lester is a 67 y.o. male with noncontributory past medical history who presents with concern for tick bites back earlier today.  Patient reports it is red, itchy.  They report that it had a white dot in the center of his back.  They report that it was not engorged with blood, they are unsure how long it was there, all tick parts were removed.  HPI     Home Medications Prior to Admission medications   Medication Sig Start Date End Date Taking? Authorizing Provider  doxycycline (VIBRAMYCIN) 100 MG capsule Take 1 capsule (100 mg total) by mouth 2 (two) times daily. 11/11/22  Yes Kaycen Whitworth H, PA-C  aspirin EC 81 MG tablet Take 1 tablet (81 mg total) by mouth daily. 03/11/18   Tysinger, Kermit Balo, PA-C  atorvastatin (LIPITOR) 40 MG tablet Take 1 tablet (40 mg total) by mouth daily. Please make overdue appt with Dr. Anne Fu before anymore refills. 1st attempt 04/02/19   Jake Bathe, MD  cilostazol (PLETAL) 50 MG tablet Take 1 tablet by mouth twice daily 04/02/19   Tysinger, Kermit Balo, PA-C  latanoprost (XALATAN) 0.005 % ophthalmic solution INSTILL ONE DROP INTO LEFT EYE ONCE DAILY. INSTILL 1 DROP INTO BOTH EYES AT BEDTIME. 12/24/17   [provider]  lisinopril (ZESTRIL) 5 MG tablet Take 1 tablet by mouth once daily 04/02/19   Tysinger, Kermit Balo, PA-C  meloxicam (MOBIC) 7.5 MG tablet Take 1 tablet (7.5 mg total) by mouth daily. 07/05/17   Helane Gunther, DPM  metFORMIN (GLUCOPHAGE) 500 MG tablet Take 1 tablet (500 mg total) by mouth daily with breakfast. 09/18/18   Tysinger, Kermit Balo, PA-C  pantoprazole (PROTONIX) 40 MG tablet TAKE 1 TABLET BY MOUTH ONCE DAILY. TAKE PRIOR TO BREAKFAST. 09/18/18   Tysinger, Kermit Balo, PA-C  tamsulosin (FLOMAX) 0.4 MG CAPS capsule Take 1  capsule (0.4 mg total) by mouth daily. 09/18/18   Tysinger, Kermit Balo, PA-C      Allergies    Patient has no known allergies.    Review of Systems   Review of Systems  All other systems reviewed and are negative.   Physical Exam Updated Vital Signs BP 119/81 (BP Location: Right Arm)   Pulse 69   Temp 97.6 F (36.4 C) (Oral)   Resp 16   Ht 5\' 7"  (1.702 m)   Wt 90.7 kg   SpO2 98%   BMI 31.32 kg/m  Physical Exam Vitals and nursing note reviewed.  Constitutional:      General: He is not in acute distress.    Appearance: Normal appearance.  HENT:     Head: Normocephalic and atraumatic.  Eyes:     General:        Right eye: No discharge.        Left eye: No discharge.  Cardiovascular:     Rate and Rhythm: Normal rate and regular rhythm.  Pulmonary:     Effort: Pulmonary effort is normal. No respiratory distress.  Musculoskeletal:        General: No deformity.  Skin:    General: Skin is warm and dry.     Comments: Patient with raised red bump just overlying the right scapula.  There is no ring of redness surrounding it,  no targetoid lesion.  No retained insect parts.  He has no petechial rash on the rest of the body.  Neurological:     Mental Status: He is alert and oriented to person, place, and time.  Psychiatric:        Mood and Affect: Mood normal.        Behavior: Behavior normal.     ED Results / Procedures / Treatments   Labs (all labs ordered are listed, but only abnormal results are displayed) Labs Reviewed - No data to display  EKG None  Radiology No results found.  Procedures Procedures    Medications Ordered in ED Medications  doxycycline (VIBRA-TABS) tablet 100 mg (has no administration in time range)    ED Course/ Medical Decision Making/ A&P                             Medical Decision Making  This patient is a 67 y.o. male who presents to the ED for concern of tick bite to mid back with itching, redness, tick was pulled off just  earlier today..   Differential diagnoses prior to evaluation: Simple bug bite, developing cellulitis, RMSF, Lyme, or other tickborne illness such as babesiosis  Past Medical History / Social History / Additional history: Chart reviewed. Pertinent results include: Overall noncontributory  Physical Exam: Physical exam performed. The pertinent findings include: Patient with red raised bump on the back, no evidence of target sign, no petechial rash, no retained insect parts, vital signs stable  Medications / Treatment: Will treat with doxycycline for tick bite prophylaxis   Disposition: After consideration of the diagnostic results and the patients response to treatment, I feel that patient with tick bite from [facility] tick.  Although based on his history I think that it was unlikely to have been in place for greater than 24 hours we will treat with prophylactic doxycycline, encourage close PCP follow-up.   emergency department workup does not suggest an emergent condition requiring admission or immediate intervention beyond what has been performed at this time. The plan is: as above. The patient is safe for discharge and has been instructed to return immediately for worsening symptoms, change in symptoms or any other concerns.  Final Clinical Impression(s) / ED Diagnoses Final diagnoses:  Tick bite of left back wall of thorax, initial encounter    Rx / DC Orders ED Discharge Orders          Ordered    doxycycline (VIBRAMYCIN) 100 MG capsule  2 times daily        11/11/22 2109              Danil Wedge, Edyth Gunnels 11/11/22 2112    Eber Hong, MD 11/11/22 [phone]

## 2022-11-11 NOTE — ED Triage Notes (Signed)
Patient found tick on upper back, c/o itching and redness. No acute distress noted.

## 2023-03-01 DIAGNOSIS — M7542 Impingement syndrome of left shoulder: Secondary | ICD-10-CM | POA: Diagnosis not present

## 2023-03-01 DIAGNOSIS — M25512 Pain in left shoulder: Secondary | ICD-10-CM | POA: Diagnosis not present

## 2023-03-22 DIAGNOSIS — E7800 Pure hypercholesterolemia, unspecified: Secondary | ICD-10-CM | POA: Diagnosis not present

## 2023-03-22 DIAGNOSIS — N400 Enlarged prostate without lower urinary tract symptoms: Secondary | ICD-10-CM | POA: Diagnosis not present

## 2023-03-22 DIAGNOSIS — I739 Peripheral vascular disease, unspecified: Secondary | ICD-10-CM | POA: Diagnosis not present

## 2023-03-22 DIAGNOSIS — I10 Essential (primary) hypertension: Secondary | ICD-10-CM | POA: Diagnosis not present

## 2023-03-22 DIAGNOSIS — E119 Type 2 diabetes mellitus without complications: Secondary | ICD-10-CM | POA: Diagnosis not present

## 2023-03-22 DIAGNOSIS — Z299 Encounter for prophylactic measures, unspecified: Secondary | ICD-10-CM | POA: Diagnosis not present

## 2023-03-28 DIAGNOSIS — Z136 Encounter for screening for cardiovascular disorders: Secondary | ICD-10-CM | POA: Diagnosis not present

## 2023-03-28 DIAGNOSIS — R079 Chest pain, unspecified: Secondary | ICD-10-CM | POA: Diagnosis not present

## 2023-04-30 DIAGNOSIS — H40053 Ocular hypertension, bilateral: Secondary | ICD-10-CM | POA: Diagnosis not present

## 2023-11-21 ENCOUNTER — Other Ambulatory Visit: Payer: Self-pay

## 2023-11-21 ENCOUNTER — Emergency Department (HOSPITAL_COMMUNITY)

## 2023-11-21 ENCOUNTER — Emergency Department (HOSPITAL_COMMUNITY)
Admission: EM | Admit: 2023-11-21 | Discharge: 2023-11-21 | Disposition: A | Discharge status: Home / Self Care | Attending: Emergency Medicine | Admitting: Emergency Medicine

## 2023-11-21 ENCOUNTER — Encounter (HOSPITAL_COMMUNITY): Payer: Self-pay

## 2023-11-21 DIAGNOSIS — Z7982 Long term (current) use of aspirin: Secondary | ICD-10-CM | POA: Diagnosis not present

## 2023-11-21 DIAGNOSIS — M5442 Lumbago with sciatica, left side: Secondary | ICD-10-CM | POA: Diagnosis not present

## 2023-11-21 DIAGNOSIS — Z7984 Long term (current) use of oral hypoglycemic drugs: Secondary | ICD-10-CM | POA: Diagnosis not present

## 2023-11-21 DIAGNOSIS — M545 Low back pain, unspecified: Secondary | ICD-10-CM | POA: Diagnosis present

## 2023-11-21 DIAGNOSIS — M5441 Lumbago with sciatica, right side: Secondary | ICD-10-CM | POA: Diagnosis not present

## 2023-11-21 MED ORDER — CYCLOBENZAPRINE HCL 10 MG PO TABS: 10.0000 mg | ORAL_TABLET | Freq: Two times a day (BID) | ORAL | 0 refills | Status: AC | PRN

## 2023-11-21 MED ORDER — METHYLPREDNISOLONE SODIUM SUCC 125 MG IJ SOLR
60.0000 mg | Freq: Once | INTRAMUSCULAR | Status: AC
  Administered 2023-11-21: 60 mg via INTRAMUSCULAR
  Filled 2023-11-21: qty 2

## 2023-11-21 MED ORDER — ACETAMINOPHEN 500 MG PO TABS
1000.0000 mg | ORAL_TABLET | Freq: Once | ORAL | Status: AC
  Administered 2023-11-21: 1000 mg via ORAL
  Filled 2023-11-21: qty 2

## 2023-11-21 MED ORDER — OXYCODONE HCL 5 MG PO TABS
5.0000 mg | ORAL_TABLET | ORAL | Status: AC
  Administered 2023-11-21: 5 mg via ORAL
  Filled 2023-11-21: qty 1

## 2023-11-21 MED ORDER — LIDOCAINE 5 % EX PTCH
1.0000 | MEDICATED_PATCH | CUTANEOUS | Status: DC
  Administered 2023-11-21: 1 via TRANSDERMAL
  Filled 2023-11-21: qty 1

## 2023-11-21 NOTE — ED Triage Notes (Signed)
 Pt arrived via POV from home c/o lumbar back pain. Pt reports previous surgery on lumbar spine 9 years ago, and about a week ago his pain flared up on him. Pt reports the pain is causing him to be unable to walk very well.

## 2023-11-21 NOTE — ED Provider Notes (Signed)
 [place] EMERGENCY DEPARTMENT AT [facility] Provider Note   CSN: [id number] Arrival date & time: 11/21/23  1601     History  Chief Complaint  Patient presents with   Back Pain    Joshua Lester is a 68 y.o. male.  68 year old male with a history of L5-S1 microdiscectomy who presents emergency department with back pain.  Patient reports that on Friday he was riding a lawnmower and then when he got inside he started having some back pain.  Does not recall any injuries.  Says that the pain has become severe is now impairing his ability to walk.  No bowel or bladder incontinence.  No numbness of his legs.  Not on any anticoagulation.  No history of cancer.  Denies history of IV drug use.  No fevers recently.  Has not tried any medications for it at home.       Home Medications Prior to Admission medications   Medication Sig Start Date End Date Taking? Authorizing Provider  cyclobenzaprine  (FLEXERIL ) 10 MG tablet Take 1 tablet (10 mg total) by mouth 2 (two) times daily as needed for muscle spasms. 11/21/23  Yes Ninetta Basket, MD  aspirin  EC 81 MG tablet Take 1 tablet (81 mg total) by mouth daily. 03/11/18   Tysinger, Christiane Cowing, PA-C  atorvastatin  (LIPITOR) 40 MG tablet Take 1 tablet (40 mg total) by mouth daily. Please make overdue appt with Dr. Renna Cary before anymore refills. 1st attempt 04/02/19   Hugh Madura, MD  cilostazol  (PLETAL ) 50 MG tablet Take 1 tablet by mouth twice daily 04/02/19   Tysinger, Christiane Cowing, PA-C  doxycycline  (VIBRAMYCIN ) 100 MG capsule Take 1 capsule (100 mg total) by mouth 2 (two) times daily. 11/11/22   Prosperi, Christian H, PA-C  latanoprost (XALATAN) 0.005 % ophthalmic solution INSTILL ONE DROP INTO LEFT EYE ONCE DAILY. INSTILL 1 DROP INTO BOTH EYES AT BEDTIME. 12/24/17   [provider]  lisinopril  (ZESTRIL ) 5 MG tablet Take 1 tablet by mouth once daily 04/02/19   Tysinger, Christiane Cowing, PA-C  meloxicam  (MOBIC ) 7.5 MG tablet Take 1 tablet  (7.5 mg total) by mouth daily. 07/05/17   Ruffin Cotton, DPM  metFORMIN  (GLUCOPHAGE ) 500 MG tablet Take 1 tablet (500 mg total) by mouth daily with breakfast. 09/18/18   Tysinger, Christiane Cowing, PA-C  pantoprazole  (PROTONIX ) 40 MG tablet TAKE 1 TABLET BY MOUTH ONCE DAILY. TAKE PRIOR TO BREAKFAST. 09/18/18   Tysinger, Christiane Cowing, PA-C  tamsulosin  (FLOMAX ) 0.4 MG CAPS capsule Take 1 capsule (0.4 mg total) by mouth daily. 09/18/18   Tysinger, Christiane Cowing, PA-C      Allergies    Patient has no known allergies.    Review of Systems   Review of Systems  Physical Exam Updated Vital Signs BP 108/78 (BP Location: Left Arm)   Pulse 76   Temp 98 F (36.7 C) (Oral)   Resp 16   Ht 5\' 7"  (1.702 m)   Wt 94.3 kg   SpO2 96%   BMI 32.58 kg/m  Physical Exam Vitals and nursing note reviewed.  Constitutional:      General: He is not in acute distress.    Appearance: He is well-developed.  HENT:     Head: Normocephalic and atraumatic.  Musculoskeletal:     Cervical back: Normal range of motion and neck supple.     Comments: No spinal midline tenderness to palpation at approximately L3-L4 with paraspinal tenderness to palpation in that area as well.  No stepoffs noted.   Neurological:     Mental Status: He is alert. Mental status is at baseline.     Comments: Motor: Muscle bulk and tone are normal. Strength is 5/5 in hip flexion, knee flexion and extension, ankle dorsiflexion and plantar flexion bilaterally. Full strength of great toe dorsiflexion bilaterally.  Sensory: Intact sensation to light touch in L2 though S1 dermatomes bilaterally.   Reflexes: Patellar 2+ bilaterally, Achilles 2+ bilaterally, no ankle clonus bilaterally     ED Results / Procedures / Treatments   Labs (all labs ordered are listed, but only abnormal results are displayed) Labs Reviewed - No data to display  EKG None  Radiology No results found.  Procedures Procedures    Medications Ordered in ED Medications   lidocaine  (LIDODERM ) 5 % 1 patch (1 patch Transdermal Patch Applied 11/21/23 1728)  oxyCODONE  (Oxy IR/ROXICODONE ) immediate release tablet 5 mg (5 mg Oral Given 11/21/23 1728)  acetaminophen  (TYLENOL ) tablet 1,000 mg (1,000 mg Oral Given 11/21/23 1728)  methylPREDNISolone sodium succinate (SOLU-MEDROL) 125 mg/2 mL injection 60 mg (60 mg Intramuscular Given 11/21/23 1729)    ED Course/ Medical Decision Making/ A&P                                 Medical Decision Making Amount and/or Complexity of Data Reviewed Radiology: ordered.  Risk OTC drugs. Prescription drug management.   68 year old male with a history of L5-S1 microdiscectomy who presents emergency department with back pain.   Initial Ddx:  Lumbar radiculopathy, spinal cord compression, pathologic fracture, spinal epidural abscess, spinal epidural hematoma  MDM:  Feel the patient likely has lumbar radiculopathy given their symptoms.  No signs or symptoms of cord compression such as bowel or bladder incontinence or numbness or weakness that would warrant MRI.  Given his age we will obtain x-rays to rule out pathologic fracture.  No risk factors for spinal epidural abscess or spinal epidural hematoma either.  Plan:  X-ray Pain medication Reassessment  ED Summary/Re-evaluation:  X-rays without any evidence of fracture.  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  After receiving pain medication was able to ambulate without difficulty.  Will have him follow-up with spine surgery as an outpatient and have him treat his pain with cyclobenzaprine, Tylenol , and lidocaine  patches.  This patient presents to the ED for concern of complaints listed in HPI, this involves an extensive number of treatment options, and is a complaint that carries with it a high risk of complications and morbidity. Disposition including potential need for  admission considered.   Dispo: DC Home. Return precautions discussed including, but not limited to, those listed in the AVS. Allowed pt time to ask questions which were answered fully prior to dc.  Additional history obtained from spouse Records reviewed Outpatient Clinic Notes I independently reviewed the following imaging with scope of interpretation limited to determining acute life threatening conditions related to emergency care: Lumbar spine x-ray and agree with the radiologist interpretation with the following exceptions: none I have reviewed the patients home medications and made adjustments as needed Social Determinants of health:  Geriatric   Final Clinical Impression(s) / ED Diagnoses Final diagnoses:  Acute bilateral low back pain with bilateral sciatica    Rx / DC Orders ED Discharge Orders          Ordered  cyclobenzaprine (FLEXERIL) 10 MG tablet  2 times daily PRN        11/21/23 1843              Ninetta Basket, MD 11/21/23 2022

## 2023-11-21 NOTE — ED Notes (Signed)
 MD in room with patient, observe patient ambulate without difficulty. Patient reports pain is better 2/10. Medication was very effective per patient.

## 2023-11-21 NOTE — Discharge Instructions (Signed)
You were seen for your back pain in the emergency department.   At home, please use over-the-counter Tylenol and lidocaine patches.  You may also use the cyclobenzaprine we have prescribed you as needed for pain.  Do not take the cyclobenzaprine before driving or operating heavy machinery.  Follow-up with your primary doctor in 2-3 days regarding your visit.  Follow-up with the spine clinic as soon as possible regarding your symptoms.  Return immediately to the emergency department if you experience any of the following: Numbness or weakness of your legs, bowel or bladder incontinence, numbness while wiping after pooping or urinating, or any other concerning symptoms.    Thank you for visiting our Emergency Department. It was a pleasure taking care of you today.

## 2023-12-02 ENCOUNTER — Other Ambulatory Visit: Payer: Self-pay | Admitting: Physical Medicine and Rehabilitation

## 2023-12-02 ENCOUNTER — Encounter: Payer: Self-pay | Admitting: Physical Medicine and Rehabilitation

## 2023-12-02 ENCOUNTER — Ambulatory Visit: Admitting: Physical Medicine and Rehabilitation

## 2023-12-02 DIAGNOSIS — M5412 Radiculopathy, cervical region: Secondary | ICD-10-CM | POA: Diagnosis not present

## 2023-12-02 DIAGNOSIS — M5441 Lumbago with sciatica, right side: Secondary | ICD-10-CM

## 2023-12-02 DIAGNOSIS — M5442 Lumbago with sciatica, left side: Secondary | ICD-10-CM | POA: Diagnosis not present

## 2023-12-02 DIAGNOSIS — M5416 Radiculopathy, lumbar region: Secondary | ICD-10-CM | POA: Diagnosis not present

## 2023-12-02 DIAGNOSIS — G8929 Other chronic pain: Secondary | ICD-10-CM

## 2023-12-02 DIAGNOSIS — M961 Postlaminectomy syndrome, not elsewhere classified: Secondary | ICD-10-CM | POA: Diagnosis not present

## 2023-12-02 DIAGNOSIS — R29898 Other symptoms and signs involving the musculoskeletal system: Secondary | ICD-10-CM

## 2023-12-02 DIAGNOSIS — R202 Paresthesia of skin: Secondary | ICD-10-CM
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
## 2023-12-02 NOTE — Progress Notes (Signed)
 l

## 2024-01-02 ENCOUNTER — Encounter: Payer: Self-pay | Admitting: *Deleted
